# Patient Record
Sex: Male | Born: 1960 | Race: White | Hispanic: No | Marital: Single | Smoking: Former smoker
Health system: Southern US, Community
[De-identification: ages and names within clinical notes are randomized; demographics above are authoritative.]

## PROBLEM LIST (undated history)

## (undated) DIAGNOSIS — K5790 Diverticulosis of intestine, part unspecified, without perforation or abscess without bleeding: Secondary | ICD-10-CM

## (undated) DIAGNOSIS — F1011 Alcohol abuse, in remission: Secondary | ICD-10-CM

## (undated) DIAGNOSIS — N201 Calculus of ureter: Secondary | ICD-10-CM

## (undated) DIAGNOSIS — N400 Enlarged prostate without lower urinary tract symptoms: Secondary | ICD-10-CM

## (undated) DIAGNOSIS — I2510 Atherosclerotic heart disease of native coronary artery without angina pectoris: Secondary | ICD-10-CM

## (undated) DIAGNOSIS — I255 Ischemic cardiomyopathy: Secondary | ICD-10-CM

## (undated) DIAGNOSIS — I700 Atherosclerosis of aorta: Secondary | ICD-10-CM

## (undated) DIAGNOSIS — Z87891 Personal history of nicotine dependence: Secondary | ICD-10-CM

## (undated) DIAGNOSIS — Z87442 Personal history of urinary calculi: Secondary | ICD-10-CM

## (undated) DIAGNOSIS — E785 Hyperlipidemia, unspecified: Secondary | ICD-10-CM

## (undated) DIAGNOSIS — I10 Essential (primary) hypertension: Secondary | ICD-10-CM

## (undated) DIAGNOSIS — Z7902 Long term (current) use of antithrombotics/antiplatelets: Secondary | ICD-10-CM

## (undated) DIAGNOSIS — F1411 Cocaine abuse, in remission: Secondary | ICD-10-CM

## (undated) DIAGNOSIS — I252 Old myocardial infarction: Secondary | ICD-10-CM

## (undated) HISTORY — PX: INGUINAL HERNIA REPAIR: SUR1180

## (undated) HISTORY — PX: CARDIOVASCULAR STRESS TEST: SHX262

## (undated) HISTORY — DX: Ischemic cardiomyopathy: I25.5

## (undated) HISTORY — PX: CORONARY ANGIOPLASTY WITH STENT PLACEMENT: SHX49

## (undated) HISTORY — DX: Personal history of nicotine dependence: Z87.891

---

## 2006-12-18 ENCOUNTER — Inpatient Hospital Stay (HOSPITAL_COMMUNITY): Admission: EM | Admit: 2006-12-18 | Discharge: 2006-12-21 | Payer: Self-pay | Admitting: Emergency Medicine

## 2006-12-18 DIAGNOSIS — I2510 Atherosclerotic heart disease of native coronary artery without angina pectoris: Secondary | ICD-10-CM

## 2006-12-18 DIAGNOSIS — I4901 Ventricular fibrillation: Secondary | ICD-10-CM

## 2006-12-18 DIAGNOSIS — I2119 ST elevation (STEMI) myocardial infarction involving other coronary artery of inferior wall: Secondary | ICD-10-CM

## 2006-12-18 HISTORY — PX: CORONARY ANGIOPLASTY WITH STENT PLACEMENT: SHX49

## 2006-12-18 HISTORY — DX: Ventricular fibrillation: I49.01

## 2006-12-18 HISTORY — DX: ST elevation (STEMI) myocardial infarction involving other coronary artery of inferior wall: I21.19

## 2006-12-18 HISTORY — DX: Atherosclerotic heart disease of native coronary artery without angina pectoris: I25.10

## 2007-01-10 ENCOUNTER — Encounter (HOSPITAL_COMMUNITY): Admission: RE | Admit: 2007-01-10 | Discharge: 2007-01-30 | Payer: Self-pay | Admitting: Cardiology

## 2007-01-31 ENCOUNTER — Encounter (HOSPITAL_COMMUNITY): Admission: RE | Admit: 2007-01-31 | Discharge: 2007-05-01 | Payer: Self-pay | Admitting: Cardiology

## 2007-04-26 ENCOUNTER — Ambulatory Visit (HOSPITAL_COMMUNITY): Admission: RE | Admit: 2007-04-26 | Discharge: 2007-04-26 | Payer: Self-pay | Admitting: Cardiology

## 2008-02-11 ENCOUNTER — Emergency Department (HOSPITAL_COMMUNITY): Admission: EM | Admit: 2008-02-11 | Discharge: 2008-02-11 | Payer: Self-pay | Admitting: Emergency Medicine

## 2010-02-20 ENCOUNTER — Encounter: Payer: Self-pay | Admitting: Cardiology

## 2010-05-16 LAB — POCT CARDIAC MARKERS
CKMB, poc: <1 ng/mL — ABNORMAL LOW (ref 1.0–8.0)
Troponin i, poc: <0.05 ng/mL (ref 0.00–0.09)

## 2010-05-16 LAB — CBC
HCT: 49.5 % (ref 39.0–52.0)
Platelets: 291 10*3/uL (ref 150–400)
RDW: 12.9 % (ref 11.5–15.5)

## 2010-05-16 LAB — POCT I-STAT, CHEM 8
BUN: 16 mg/dL (ref 6–23)
Calcium, Ion: 1.14 mmol/L (ref 1.12–1.32)
Chloride: 106 mEq/L (ref 96–112)
Glucose, Bld: 84 mg/dL (ref 70–99)
Hemoglobin: 17.3 g/dL — ABNORMAL HIGH (ref 13.0–17.0)
Potassium: 3.7 mEq/L (ref 3.5–5.1)
TCO2: 21 mmol/L (ref 0–100)

## 2010-05-16 LAB — DIFFERENTIAL
Basophils Relative: 0 % (ref 0–1)
Eosinophils Relative: 1 % (ref 0–5)
Monocytes Relative: 7 % (ref 3–12)
Neutrophils Relative %: 77 % (ref 43–77)

## 2010-06-14 NOTE — Discharge Summary (Signed)
[name], [name]              ACCOUNT NO.:  [id number]   MEDICAL RECORD NO.:  [id number]          PATIENT TYPE:  INP   LOCATION:  2020                         FACILITY:  [facility]   PHYSICIAN:  Eduardo Osier. Sharyn Lull, M.D. DATE OF BIRTH:  1960/02/10   DATE OF ADMISSION:  12/18/2006  DATE OF DISCHARGE:  12/21/2006                               DISCHARGE SUMMARY   ADMISSION DIAGNOSES:  1. Acute inferoposterior wall myocardial infarction.  2. Tobacco abuse.  3. Positive family history of coronary artery disease.  4. History of cocaine abuse in the remote past.   DISCHARGE DIAGNOSES:  1. Acute inferoposterior wall myocardial infarction, status post      emergency percutaneous transluminal coronary angioplasty and      stenting to proximal and mid right coronary artery.  2. Tobacco abuse.  3. Family history of coronary artery disease.  4. Hypercholesteremia, status post nonsustained ventricular      tachycardia and paroxysmal atrial fibrillation.  5. Remote history of cocaine abuse in the past.   DISCHARGE MEDICATIONS:  Enteric-coated aspirin 325 mg 1 tablet daily,  Plavix 75 mg 1 tablet daily with food, Toprol XL 25 mg 1 tablet daily,  Altace 2.5 mg 1 capsule daily, Lipitor 80 mg 1 tablet daily, Nitrostat  0.4 mg sublingual use as directed.   DIET:  Low salt, low cholesterol.   ACTIVITY:  Increase activity slowly as tolerated.  No lifting, driving  or sexual activity for 1 week.   Post PTCA and stent instructions have been given.  Follow up with me in  1 week.   CONDITION ON DISCHARGE:  Stable.  The patient will be scheduled for  phase II cardiac rehab as outpatient.   BRIEF HISTORY AND HOSPITAL COURSE:  Mr. Jeremy [name] is a 50 year old white  male with no significant past medical history except for tobacco abuse  and remote cocaine abuse, came to the ER via EMS complaining of  retrosternal chest pressure, grade 9/10 associated with diaphoresis  which woke him up around 1:30 a.m.  He   went to bed again, woke up  around 2:30 a.m. with similar chest pressures so called EMS.  EKG done  on the field showed normal sinus rhythm with ST elevation in inferior  leads and ST depression in V1 and V2 and reciprocal changes in I and aVL  suggestive of inferoposterior wall injury pattern.  The patient states  he had similar chest pain but less severe approximately 1 week ago but  did not seek medical attention.  Denies any recent cocaine abuse.   PAST MEDICAL HISTORY:  As above.   PAST SURGICAL HISTORY:  Had inguinal hernia repair.   SOCIAL HISTORY:  He is single.  Smoked 1 pack per day for 3 to 4 years,  quit 1 week ago.  No history of alcohol abuse.  Smoked cocaine a few  years ago.   FAMILY HISTORY:  Father died of MI at age of 51.  Mother is alive in  good health.  One brother and one sister in good health.   ALLERGIES:  NO KNOWN DRUG ALLERGIES.  MEDICATIONS:  None.   PHYSICAL EXAMINATION:  GENERAL:  On examination he is alert and oriented  x 3, complaining of severe chest pain.  VITAL SIGNS:  Blood pressure was 114/70, pulse was 66 regular.  HEENT:  Conjunctivae were pink.  NECK:  Supple, no JVD.  LUNGS:  Clear to auscultation without rhonchi or rales.  CARDIOVASCULAR:  S1 and S2 were normal.  There was soft systolic murmur and S4 gallop.  ABDOMEN:  Soft.  Bowel sounds were present, nontender.  EXTREMITIES:  There is no clubbing, cyanosis or edema.   LABORATORY DATA:  His labs are not available in the chart.  His  admission EKG showed normal sinus rhythm with ST elevation in II, III,  aVF and ST depression in V1 and V2 with reciprocal changes in lead I and  aVL suggestive of inferoposterior wall injury pattern.  Repeat EKG done  on November 18 showed atrial fibrillation with moderate ventricular  response, marked improvement in ST elevation and resolution of ST  depression in V1 and V2.  Repeat EKG on November 19 showed normal sinus  rhythm with minimal ST  elevation and very small Q-waves in the inferior  leads, resolution of ST depression in V1 and V2.  There were nonspecific  T-wave changes.   BRIEF HOSPITAL COURSE:  The patient was directly taken to the cath lab  and underwent emergency PTCA and stenting to RCA as per procedure report  with excellent results with resolution of his ST depression during the  procedure.  The patient tolerated the procedure well.  The patient did  not have any episodes of anginal chest pain during the hospital stay but  had a brief episode of nonsustained VT and paroxysmal A Fib,  which  resolved by IV amiodarone for 24 hours.  The patient is off amiodarone  for the last 2 days.  There is no evidence of cardiac arrhythmias.  Phase 1 cardiac rehab was called.  The patient has been ambulating in  the hallway without any problems.  His groin is stable with no evidence  of hematoma or bruit.  There is some mild ecchymosis in the right groin.  The patient is interested in phase 2  cardiac rehab and will be  scheduled as outpatient.  The patient will be discharged home on above  medications and will be followed up in my office in 1 week      Jeremy N. Sharyn Lull, M.D.  Electronically Signed     M[facility]/MEDQ  D:  12/21/2006  T:  12/21/2006  Job:  [id number]   cc:   Cath Lab

## 2010-06-14 NOTE — Cardiovascular Report (Signed)
Jeremy [name], Jeremy [name]              ACCOUNT NO.:  [id number]   MEDICAL RECORD NO.:  [id number]          PATIENT TYPE:  INP   LOCATION:  2901                         FACILITY:  [facility]   PHYSICIAN:  Jeremy [name]. Jeremy [name], M.D. DATE OF BIRTH:  04-Jun-1960   DATE OF PROCEDURE:  12/18/2006  DATE OF DISCHARGE:                            CARDIAC CATHETERIZATION   PROCEDURE:  1. Left cardiac cath with selective left and right coronary      angiography, LV graft via right groin using Judkins technique.  2. Insertion of temporary transvenous pacer via right femoral venous      approach.  3. Successful PTCA to proximal RCA using 2.5 x 13-mm long Voyager      balloon.  4. Successful deployment of 3.5 x 23-mm long Cypher drug-eluting stent      in proximal and mid RCA.  5. Successful aspiration of thrombus using French aspiration catheter.   INDICATIONS FOR PROCEDURE:  Jeremy [name] is 50 year old white male with no  significant past medical history except for tobacco abuse, remote  history of cocaine abuse and positive family history of coronary artery  disease. He came to the ER via EMS complaining of retrosternal chest  pressure grade 9 or 10 associated with diaphoresis which woke him up  around 1:30 a.m. He went to the bed again and woke up around 2:30 a.m.  with similar chest pressure so called EMS.  EKG done on the field showed  normal sinus rhythm with ST elevation in inferior leads and ST  depression in V1 and V2 and __________ depression in leads I and aVL  suggestive of inferoposterior wall injury pattern.  The patient states  he had similar chest pain but less severe approximately 1 week ago but  did not seek any medical attention.  Denies any recent cocaine abuse.   PAST MEDICAL HISTORY:  As above.   PAST SURGICAL HISTORY:  He had an inguinal hernia repair in the past.   SOCIAL HISTORY:  He is single and smokes one pack per day for 3-4 years,  quit 1 week ago.  No history of alcohol  abuse.  Smoked cocaine a few  years ago.   FAMILY HISTORY:  Father died of MI at the age of 46.  Mother is alive in  good health.  One brother and one sister in good health.   ALLERGIES:  No known drug allergies.   MEDICATIONS:  None.   PHYSICAL EXAMINATION:  He is alert and oriented x3 in no acute distress  complaining of severe chest pain.  VITAL SIGNS:  Blood pressure was 114/70, pulse was 66 and regular.  Conjunctivae was pink.  NECK:  Supple.  No JVD.  LUNGS:  Clear to auscultation without rhonchi or rales.  CARDIOVASCULAR:  S1, S2 was normal.  There was a soft systolic murmur  and S4 gallop.  ABDOMEN:  Soft.  Bowel sounds are present, nontender.  EXTREMITIES:  There is no clubbing, cyanosis or edema.   IMPRESSION:  Acute inferoposterior wall MI, tobacco abuse, positive  family history of coronary artery disease, history of cocaine abuse.  Discussed  with the patient regarding emergency left cath, possible PTCA  stenting, its risks and benefits i.e. death and mild stroke, need for  emergency CABG, risk of restenosis, risk of cardiac arrhythmias and  insertion of temporary pacer, etc. and consented for PCI.   PROCEDURE:  After obtaining informed consent, the patient was brought to  the cath lab and was placed on the fluoroscopy table.  The right groin  was prepped and draped in the usual fashion.  1% Xylocaine was used for  local anesthesia in the right groin. With the help of a thin-wall  needle,  a 6-French arterial and 6-French arterial and 6-French venous  sheaths were placed. Both these sheaths were aspirated and flushed.  Next a 6-French left Judkins catheter was advanced over the wire under  fluoroscopic guidance up to the ascending aorta.  The wire was pulled  out, the catheter was aspirated and connected to the manifold.  The  catheter was further advanced and engaged into the left coronary ostium.  Multiple views of the left system were taken.  Next the catheter  was  disengaged and was pulled out over the wire and was replaced with a 6-  [place] right Judkins catheter which was advanced over the wire under  fluoroscopic guidance up to the ascending aorta.  The wire was pulled  out, the catheter was aspirated and connected to the manifold.  The  catheter was further advanced and engaged into right coronary ostium.  The single view of right coronary artery was obtained.  Next the right  Judkins catheter was pulled out over the wire and was replaced with a 6-  French pigtail catheter at the end of the procedure which was advanced  over the wire under fluoroscopic guidance up to the ascending aorta.  The catheter was further advanced across aortic valve into the LV and LV  pressures were recorded.  Next LV graft was done in 30 degree RAO  position.  Post angiographic pressures were recorded from LV and then  pullback pressures were recorded from the aorta.  There was no gradient  across the aortic valve.  Next, a pigtail catheter was pulled out over  the wire, sheaths were aspirated and flushed.   FINDINGS:  LV showed good LV systolic function, EF of 50-55%, the left  main was patent.  The LAD has 30-35% mid stenosis.  Diagonal 1 and  diagonal 2 were small which were patent.  Diagonal 3 was very small. The  left circumflex has 60-70% proximal stenosis and 40-50% mid sequential  stenosis.  OM1 is large which has 20-25% proximal stenosis.  OM-2 is  small but is patent.  RCA was 100% occluded proximally with TIMI 0 flow.  A temporary transvenous pacer was inserted via the right femoral venous  approach up to RV apex without difficulty prior to PCI.   INTERVENTIONAL PROCEDURE:  Successful PTCA to proximal RCA was done  using 2.5 x 13-mm long Voyager balloon for predilatation and then 3.5 x  23-mm long Cypher drug-eluting stent was deployed in proximal RCA at 15  atmospheric pressure.  The stent was postdilated using 4.0 x 12-mm long  PowerSail balloon  going up to 21 atmospheres of pressure.  Angiogram  showed filling defect in the mid RCA suggestive of thrombus.  Multiple  passes of [place] catheter were done for thrombus aspiration with minimal  improvement in angiographic appearance and then 3.5 x 23-mm long Cypher  drug-eluting stent was deployed in mid RCA overlapping  the proximal  stent at 16 atmospheric pressure.  Stent was postdilated using 4.0 x 18-  mm long PowerSail balloon going up to 23 atmospheres pressure. The  lesion was dilated from 100% to 0% as well as excellent TIMI grade 3  distal flow without evidence of dissection or distal embolization.  The  patient had one episode of V-fib prior to PCI requiring defibrillation  x1 with conversion to sinus rhythm.  The patient received weight based  heparin and __________ 600 mg of Plavix, intracoronary nitrates and  verapamil during the procedure.  The temporary pacemaker was  discontinued at the end of the procedure.  The patient tolerated the  procedure well.  There were no complications.  The patient also received a bolus of amiodarone and was started on the  amiodarone drip at the end of the procedure.  The patient tolerated the  procedure well.  There were no complications.  The patient was  transferred to recovery room in stable condition.      Jeremy [name]. Jeremy [name], M.D.  Electronically Signed     MNH/MEDQ  D:  12/18/2006  T:  12/18/2006  Job:  [id number]

## 2010-11-08 LAB — POCT CARDIAC MARKERS: CKMB, poc: <1 — ABNORMAL LOW

## 2010-11-08 LAB — BASIC METABOLIC PANEL
Calcium: 8.8
Chloride: 106
Creatinine, Ser: 0.87
Creatinine, Ser: 0.97
GFR calc Af Amer: >60
GFR calc non Af Amer: >60

## 2010-11-08 LAB — COMPREHENSIVE METABOLIC PANEL
AST: 117 — ABNORMAL HIGH
Alkaline Phosphatase: 45
Calcium: 7.4 — ABNORMAL LOW
Creatinine, Ser: 0.87
GFR calc Af Amer: >60
Potassium: 4

## 2010-11-08 LAB — PROTIME-INR: Prothrombin Time: 13.3

## 2010-11-08 LAB — CARDIAC PANEL(CRET KIN+CKTOT+MB+TROPI)
Relative Index: 7.1 — ABNORMAL HIGH
Relative Index: 7.9 — ABNORMAL HIGH
Total CK: 310 — ABNORMAL HIGH
Total CK: 779 — ABNORMAL HIGH
Troponin I: 10.44
Troponin I: 16.57

## 2010-11-08 LAB — I-STAT 8, (EC8 V) (CONVERTED LAB)
Acid-Base Excess: 1
BUN: 13
Chloride: 104
Glucose, Bld: 131 — ABNORMAL HIGH
Sodium: 140
TCO2: 26
pCO2, Ven: 36.8 — ABNORMAL LOW

## 2010-11-08 LAB — URINE DRUGS OF ABUSE SCREEN W ALC, ROUTINE (REF LAB)
Amphetamine Screen, Ur: NEGATIVE
Barbiturate Quant, Ur: NEGATIVE
Benzodiazepines.: POSITIVE — AB
Cocaine Metabolites: NEGATIVE
Ethyl Alcohol: <5
Marijuana Metabolite: NEGATIVE
Methadone: NEGATIVE
Opiate Screen, Urine: POSITIVE — AB
Phencyclidine (PCP): NEGATIVE
Propoxyphene: NEGATIVE

## 2010-11-08 LAB — LIPID PANEL
Cholesterol: 167
HDL: 29 — ABNORMAL LOW
Total CHOL/HDL Ratio: 5.8
Triglycerides: 94

## 2010-11-08 LAB — CBC
HCT: 35.8 — ABNORMAL LOW
Hemoglobin: 12.2 — ABNORMAL LOW
Hemoglobin: 14.3
MCHC: 34.1
MCHC: 34.9
MCV: 88.3
MCV: 89.2
Platelets: 251
Platelets: 257
RBC: 4.02 — ABNORMAL LOW
RDW: 12.1
RDW: 12.2
WBC: 11.6 — ABNORMAL HIGH

## 2010-11-08 LAB — OPIATE, QUANTITATIVE, URINE
Codeine Urine: NEGATIVE ng/mL
Hydrocodone: NEGATIVE ng/mL
Hydromorphone GC/MS Conf: NEGATIVE ng/mL
Morphine, Confirm: 2560 ng/mL

## 2010-11-08 LAB — BENZODIAZEPINE, QUANTITATIVE, URINE
Alprazolam (GC/LC/MS), ur confirm: NEGATIVE
Nordiazepam GC/MS Conf: NEGATIVE

## 2010-11-08 LAB — PLATELET COUNT: Platelets: 308

## 2011-02-01 ENCOUNTER — Other Ambulatory Visit: Payer: Self-pay | Admitting: Cardiology

## 2011-08-18 ENCOUNTER — Other Ambulatory Visit: Payer: Self-pay | Admitting: Cardiology

## 2011-09-24 ENCOUNTER — Emergency Department: Payer: Self-pay | Admitting: Emergency Medicine

## 2011-09-24 LAB — URINALYSIS, COMPLETE
Bacteria: NONE SEEN
Glucose,UR: NEGATIVE mg/dL (ref 0–75)
Leukocyte Esterase: NEGATIVE
Nitrite: NEGATIVE
Protein: NEGATIVE
Specific Gravity: 1.024 (ref 1.003–1.030)
WBC UR: 1 /HPF (ref 0–5)

## 2011-09-24 LAB — COMPREHENSIVE METABOLIC PANEL
Anion Gap: 7 (ref 7–16)
Calcium, Total: 9 mg/dL (ref 8.5–10.1)
Chloride: 108 mmol/L — ABNORMAL HIGH (ref 98–107)
Co2: 26 mmol/L (ref 21–32)
EGFR (African American): >60
Osmolality: 283 (ref 275–301)
Potassium: 4.3 mmol/L (ref 3.5–5.1)
SGOT(AST): 25 U/L (ref 15–37)

## 2011-09-24 LAB — CBC
MCH: 29.2 pg (ref 26.0–34.0)
MCHC: 33.8 g/dL (ref 32.0–36.0)
RDW: 12.5 % (ref 11.5–14.5)

## 2011-09-24 LAB — LIPASE, BLOOD: Lipase: 142 U/L (ref 73–393)

## 2011-09-27 ENCOUNTER — Other Ambulatory Visit: Payer: Self-pay | Admitting: Urology

## 2011-11-08 ENCOUNTER — Encounter (HOSPITAL_BASED_OUTPATIENT_CLINIC_OR_DEPARTMENT_OTHER): Payer: Self-pay | Admitting: *Deleted

## 2011-11-08 NOTE — Progress Notes (Signed)
NPO AFTER MN. ARRIVES AT NEED ISTAT AND KUB. LAST OFFICE NOTE, EKG AND ANY CARDIAC TEST TO BE FAXED FROM DR Sharyn Lull [phone]) . WILL TAKE METOPROLOL AND LIPITOR AM OF SURG W/ SIP OF WATER.

## 2011-11-10 NOTE — H&P (Signed)
istory of Present Illness      Notes from [facility] he was seen on 09/24/11 revealed he was expressing frequency and hesitancy as well as a two-week history of right flank pain. His creatinine was noted to be normal 1.0 and a CT scan done revealed an 8 mm stone located either in the bladder or at the ureterovesical junction with minimal hydronephrosis noted on the right. No mention of renal calculi was made in the report.     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   Interval history: He reports he continues to have intermittent pain. He also is having frequency and urgency as well as nocturia. This is associated with back and flank pain. It is not modified by positional change. It is moderate in severity.   Past Medical History Problems  1. History of  Acute Myocardial Infarction V12.59 2. 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 History of  Hypercholesterolemia 272.0  Surgical History Problems  1. History of  Heart Surgery 2. History of  Hernia Repair 3. History of  Tonsillectomy  Current Meds 1. Lipitor TABS; Therapy: (Recorded:05May2010) to 2. MetFORMIN HCl TABS; Therapy: (Recorded:05May2010) to 3. Plavix TABS; Therapy: (Recorded:05May2010) to  Allergies Medication  1. No Known Drug Allergies  Family History Problems  1. Paternal history of  Death In The Family Father age 26; heart attack 2. Family history of  Family Health Status Number Of Children 1 son  Social History Problems    Caffeine Use 1 a day   Marital History - Single   Occupation: industrial   Tobacco Use V15.82 1 pk a day for 5 yrs; quit 3 yrs ago Denied    Alcohol Use  Review of Systems Genitourinary, constitutional, skin, eye, otolaryngeal, hematologic/lymphatic, cardiovascular, pulmonary, endocrine, musculoskeletal, gastrointestinal, neurological and psychiatric system(s) were reviewed and pertinent findings if  present are noted.  Genitourinary: urinary frequency, nocturia, weak urinary stream, hematuria, erectile dysfunction and penile pain.  Gastrointestinal: constipation.  Musculoskeletal: back pain.    Vitals Vital Signs [  BMI Calculated: 28.79 BSA Calculated: 2 Height: 5 ft 8 in Weight: 190 lb  Temperature: 97 F  Physical Exam Constitutional: Well nourished and well developed . No acute distress.  ENT:. The ears and nose are normal in appearance.  Neck: The appearance of the neck is normal and no neck mass is present.  Pulmonary: No respiratory distress and normal respiratory rhythm and effort.  Cardiovascular: Heart rate and rhythm are normal . No peripheral edema.  Abdomen: The abdomen is soft and nontender. No masses are palpated. No CVA tenderness. No hernias are palpable. No hepatosplenomegaly noted.  Lymphatics: The femoral and inguinal nodes are not enlarged or tender.  Skin: Normal skin turgor, no visible rash and no visible skin lesions.  Neuro/Psych:. Mood and affect are appropriate.    Results/Data Urine [ COLOR YELLOW   APPEARANCE CLEAR   SPECIFIC GRAVITY 1.020   pH 6.5   GLUCOSE NEG mg/dL  BILIRUBIN NEG   KETONE NEG mg/dL  BLOOD TRACE   PROTEIN NEG mg/dL  UROBILINOGEN 2 mg/dL  NITRITE NEG   LEUKOCYTE ESTERASE NEG   SQUAMOUS EPITHELIAL/HPF NONE SEEN   WBC 0-2 WBC/hpf  RBC 3-6 RBC/hpf  BACTERIA RARE   CRYSTALS NONE SEEN   CASTS NONE SEEN    Old records or history reviewed: Notes from the ER as above.  The following images/tracing/specimen were independently visualized:  KUB:.  The following clinical lab  reports were reviewed:  His urinalysis had microscopic hematuria but was otherwise negative in the ER.  The following radiology reports were reviewed: CT scan as above.    Assessment Assessed  1. Distal Ureteral Stone On The Right 592.1   We discussed the fact that he is a stone located in his intramural ureter. That is what is causing his irritative  symptoms I therefore recommended VESIcare and will give him samples of that to help with those symptoms. Meantime he is already on medical expulsive therapy. We did discuss the treatment options including lithotripsy and ureteroscopy. The location of the stone would lend itself best to ureteroscopy I therefore went over the procedure with him in detail including its risks and complications. He is going to proceed with ureteroscopy and laser lithotripsy of his distal right ureteral stone   Plan Distal Ureteral Stone On The Right (592.1)     1. VESIcare samples 5 mg given today. 2. I gave him a prescription for oxycodone. 3. Be scheduled for right ureteroscopy and laser lithotripsy of his right distal ureteral stone.

## 2011-11-13 ENCOUNTER — Encounter (HOSPITAL_BASED_OUTPATIENT_CLINIC_OR_DEPARTMENT_OTHER)
Admission: RE | Disposition: A | Discharge status: Home / Self Care | Payer: Self-pay | Source: Ambulatory Visit | Attending: Urology

## 2011-11-13 ENCOUNTER — Ambulatory Visit (HOSPITAL_COMMUNITY): Payer: 59

## 2011-11-13 ENCOUNTER — Ambulatory Visit (HOSPITAL_BASED_OUTPATIENT_CLINIC_OR_DEPARTMENT_OTHER)
Admission: RE | Admit: 2011-11-13 | Discharge: 2011-11-13 | Disposition: A | Discharge status: Home / Self Care | Payer: 59 | Source: Ambulatory Visit | Attending: Urology | Admitting: Urology

## 2011-11-13 ENCOUNTER — Ambulatory Visit (HOSPITAL_BASED_OUTPATIENT_CLINIC_OR_DEPARTMENT_OTHER): Payer: 59 | Admitting: Anesthesiology

## 2011-11-13 ENCOUNTER — Encounter (HOSPITAL_BASED_OUTPATIENT_CLINIC_OR_DEPARTMENT_OTHER): Payer: Self-pay | Admitting: Anesthesiology

## 2011-11-13 ENCOUNTER — Encounter (HOSPITAL_BASED_OUTPATIENT_CLINIC_OR_DEPARTMENT_OTHER): Payer: Self-pay | Admitting: *Deleted

## 2011-11-13 DIAGNOSIS — Z79899 Other long term (current) drug therapy: Secondary | ICD-10-CM | POA: Insufficient documentation

## 2011-11-13 DIAGNOSIS — E7800 Pure hypercholesterolemia, unspecified: Secondary | ICD-10-CM | POA: Insufficient documentation

## 2011-11-13 DIAGNOSIS — N201 Calculus of ureter: Secondary | ICD-10-CM | POA: Insufficient documentation

## 2011-11-13 DIAGNOSIS — I252 Old myocardial infarction: Secondary | ICD-10-CM | POA: Insufficient documentation

## 2011-11-13 DIAGNOSIS — N1330 Unspecified hydronephrosis: Secondary | ICD-10-CM | POA: Insufficient documentation

## 2011-11-13 DIAGNOSIS — Z7902 Long term (current) use of antithrombotics/antiplatelets: Secondary | ICD-10-CM | POA: Insufficient documentation

## 2011-11-13 HISTORY — DX: Calculus of ureter: N20.1

## 2011-11-13 HISTORY — DX: Hyperlipidemia, unspecified: E78.5

## 2011-11-13 HISTORY — PX: URETEROSCOPY: SHX842

## 2011-11-13 HISTORY — DX: Essential (primary) hypertension: I10

## 2011-11-13 HISTORY — DX: Old myocardial infarction: I25.2

## 2011-11-13 HISTORY — DX: Cocaine abuse, in remission: F14.11

## 2011-11-13 HISTORY — DX: Atherosclerotic heart disease of native coronary artery without angina pectoris: I25.10

## 2011-11-13 LAB — POCT I-STAT 4, (NA,K, GLUC, HGB,HCT)
Glucose, Bld: 104 mg/dL — ABNORMAL HIGH (ref 70–99)
HCT: 43 % (ref 39.0–52.0)
Hemoglobin: 14.6 g/dL (ref 13.0–17.0)
Potassium: 4 mEq/L (ref 3.5–5.1)
Sodium: 142 mEq/L (ref 135–145)

## 2011-11-13 SURGERY — URETEROSCOPY
Anesthesia: General | Laterality: Right

## 2011-11-13 MED ORDER — SODIUM CHLORIDE 0.9 % IR SOLN
Status: DC | PRN
  Administered 2011-11-13: 6000 mL

## 2011-11-13 MED ORDER — CIPROFLOXACIN IN D5W 200 MG/100ML IV SOLN
200.0000 mg | INTRAVENOUS | Status: AC
  Administered 2011-11-13: 200 mg via INTRAVENOUS

## 2011-11-13 MED ORDER — HYDROCODONE-ACETAMINOPHEN 10-325 MG PO TABS: 1.0000 | ORAL_TABLET | Freq: Four times a day (QID) | ORAL | Status: DC | PRN

## 2011-11-13 MED ORDER — KETOROLAC TROMETHAMINE 30 MG/ML IJ SOLN
INTRAMUSCULAR | Status: DC | PRN
  Administered 2011-11-13: 30 mg via INTRAVENOUS

## 2011-11-13 MED ORDER — PROMETHAZINE HCL 25 MG/ML IJ SOLN: 6.2500 mg | INTRAMUSCULAR | Status: DC | PRN

## 2011-11-13 MED ORDER — BELLADONNA ALKALOIDS-OPIUM 16.2-60 MG RE SUPP
RECTAL | Status: DC | PRN
  Administered 2011-11-13: 1 via RECTAL

## 2011-11-13 MED ORDER — LIDOCAINE HCL (CARDIAC) 20 MG/ML IV SOLN
INTRAVENOUS | Status: DC | PRN
  Administered 2011-11-13: 100 mg via INTRAVENOUS

## 2011-11-13 MED ORDER — HYDROMORPHONE HCL PF 1 MG/ML IJ SOLN: 0.2500 mg | INTRAMUSCULAR | Status: DC | PRN

## 2011-11-13 MED ORDER — PROPOFOL 10 MG/ML IV BOLUS
INTRAVENOUS | Status: DC | PRN
  Administered 2011-11-13: 250 mg via INTRAVENOUS

## 2011-11-13 MED ORDER — OXYCODONE HCL 5 MG/5ML PO SOLN: 5.0000 mg | Freq: Once | ORAL | Status: AC | PRN

## 2011-11-13 MED ORDER — ACETAMINOPHEN 10 MG/ML IV SOLN: 1000.0000 mg | Freq: Once | INTRAVENOUS | Status: DC | PRN

## 2011-11-13 MED ORDER — MIDAZOLAM HCL 5 MG/5ML IJ SOLN
INTRAMUSCULAR | Status: DC | PRN
  Administered 2011-11-13: 2 mg via INTRAVENOUS

## 2011-11-13 MED ORDER — MEPERIDINE HCL 25 MG/ML IJ SOLN: 6.2500 mg | INTRAMUSCULAR | Status: DC | PRN

## 2011-11-13 MED ORDER — ONDANSETRON HCL 4 MG/2ML IJ SOLN
INTRAMUSCULAR | Status: DC | PRN
  Administered 2011-11-13: 4 mg via INTRAVENOUS

## 2011-11-13 MED ORDER — IOHEXOL 350 MG/ML SOLN
INTRAVENOUS | Status: DC | PRN
  Administered 2011-11-13: 3 mL

## 2011-11-13 MED ORDER — LIDOCAINE HCL 2 % EX GEL
CUTANEOUS | Status: DC | PRN
  Administered 2011-11-13: 1 via URETHRAL

## 2011-11-13 MED ORDER — LACTATED RINGERS IV SOLN
INTRAVENOUS | Status: DC
  Administered 2011-11-13: 100 mL/h via INTRAVENOUS
  Administered 2011-11-13: 08:00:00 via INTRAVENOUS

## 2011-11-13 MED ORDER — PHENAZOPYRIDINE HCL 200 MG PO TABS
200.0000 mg | ORAL_TABLET | Freq: Three times a day (TID) | ORAL | Status: AC
  Administered 2011-11-13: 200 mg via ORAL

## 2011-11-13 MED ORDER — DEXAMETHASONE SODIUM PHOSPHATE 4 MG/ML IJ SOLN
INTRAMUSCULAR | Status: DC | PRN
  Administered 2011-11-13: 8 mg via INTRAVENOUS

## 2011-11-13 MED ORDER — OXYCODONE HCL 5 MG PO TABS
5.0000 mg | ORAL_TABLET | Freq: Once | ORAL | Status: AC | PRN
  Administered 2011-11-13: 5 mg via ORAL

## 2011-11-13 MED ORDER — FENTANYL CITRATE 0.05 MG/ML IJ SOLN
INTRAMUSCULAR | Status: DC | PRN
  Administered 2011-11-13: 100 ug via INTRAVENOUS
  Administered 2011-11-13: 25 ug via INTRAVENOUS

## 2011-11-13 MED ORDER — PHENAZOPYRIDINE HCL 200 MG PO TABS: 200.0000 mg | ORAL_TABLET | Freq: Three times a day (TID) | ORAL | Status: DC | PRN

## 2011-11-13 SURGICAL SUPPLY — 38 items
ADAPTER CATH URET PLST 4-6FR (CATHETERS) IMPLANT
ADPR CATH URET STRL DISP 4-6FR (CATHETERS)
BAG DRAIN URO-CYSTO SKYTR STRL (DRAIN) ×2 IMPLANT
BAG DRN UROCATH (DRAIN) ×1
BASKET LASER NITINOL 1.9FR (BASKET) ×2 IMPLANT
BASKET STNLS GEMINI 4WIRE 3FR (BASKET) IMPLANT
BASKET ZERO TIP NITINOL 2.4FR (BASKET) ×2 IMPLANT
BRUSH URET BIOPSY 3F (UROLOGICAL SUPPLIES) IMPLANT
BSKT STON RTRVL 120 1.9FR (BASKET) ×1
BSKT STON RTRVL GEM 120X11 3FR (BASKET)
BSKT STON RTRVL ZERO TP 2.4FR (BASKET) ×1
CANISTER SUCT LVC 12 LTR MEDI- (MISCELLANEOUS) ×2 IMPLANT
CATH INTERMIT  6FR 70CM (CATHETERS) IMPLANT
CATH URET 5FR 28IN CONE TIP (BALLOONS)
CATH URET 5FR 70CM CONE TIP (BALLOONS) IMPLANT
CLOTH BEACON ORANGE TIMEOUT ST (SAFETY) ×2 IMPLANT
DRAPE CAMERA CLOSED 9X96 (DRAPES) ×2 IMPLANT
ELECT REM PT RETURN 9FT ADLT (ELECTROSURGICAL)
ELECTRODE REM PT RTRN 9FT ADLT (ELECTROSURGICAL) IMPLANT
GLOVE BIO SURGEON STRL SZ8 (GLOVE) ×2 IMPLANT
GOWN PREVENTION PLUS LG XLONG (DISPOSABLE) ×2 IMPLANT
GOWN STRL REIN XL XLG (GOWN DISPOSABLE) ×2 IMPLANT
GUIDEWIRE 0.038 PTFE COATED (WIRE) IMPLANT
GUIDEWIRE ANG ZIPWIRE 038X150 (WIRE) IMPLANT
GUIDEWIRE STR DUAL SENSOR (WIRE) ×2 IMPLANT
IV NS IRRIG 3000ML ARTHROMATIC (IV SOLUTION) ×8 IMPLANT
KIT BALLIN UROMAX 15FX10 (LABEL) IMPLANT
KIT BALLN UROMAX 15FX4 (MISCELLANEOUS) IMPLANT
KIT BALLN UROMAX 26 75X4 (MISCELLANEOUS)
LASER FIBER DISP (UROLOGICAL SUPPLIES) ×2 IMPLANT
PACK CYSTOSCOPY (CUSTOM PROCEDURE TRAY) ×2 IMPLANT
SET HIGH PRES BAL DIL (LABEL)
SHEATH ACCESS URETERAL 38CM (SHEATH) IMPLANT
SHEATH ACCESS URETERAL 54CM (SHEATH) IMPLANT
SHEATH URET ACCESS 12FR/35CM (UROLOGICAL SUPPLIES) ×2 IMPLANT
SHEATH URET ACCESS 12FR/55CM (UROLOGICAL SUPPLIES) IMPLANT
STENT URET 6FRX24 CONTOUR (STENTS) ×2 IMPLANT
WATER STERILE IRR 3000ML UROMA (IV SOLUTION) IMPLANT

## 2011-11-13 NOTE — Anesthesia Preprocedure Evaluation (Addendum)
Anesthesia Evaluation  Patient identified by MRN, date of birth, ID band Patient awake    Reviewed: Allergy & Precautions, H&P , NPO status , Patient's Chart, lab work & pertinent test results  Airway Mallampati: II TM Distance: >3 FB Neck ROM: Full    Dental  (+) Dental Advisory Given and Teeth Intact   Pulmonary former smoker,  breath sounds clear to auscultation  Pulmonary exam normal       Cardiovascular hypertension, Pt. on medications and Pt. on home beta blockers + CAD Rhythm:Regular Rate:Normal     Neuro/Psych negative neurological ROS  negative psych ROS   GI/Hepatic negative GI ROS, (+)     substance abuse  cocaine use,   Endo/Other  negative endocrine ROS  Renal/GU negative Renal ROS     Musculoskeletal negative musculoskeletal ROS (+)   Abdominal   Peds  Hematology negative hematology ROS (+)   Anesthesia Other Findings   Reproductive/Obstetrics                         Anesthesia Physical Anesthesia Plan  ASA: II  Anesthesia Plan: General   Post-op Pain Management:    Induction: Intravenous  Airway Management Planned: LMA  Additional Equipment:   Intra-op Plan:   Post-operative Plan: Extubation in OR  Informed Consent: I have reviewed the patients History and Physical, chart, labs and discussed the procedure including the risks, benefits and alternatives for the proposed anesthesia with the patient or authorized representative who has indicated his/her understanding and acceptance.   Dental advisory given  Plan Discussed with: CRNA  Anesthesia Plan Comments:        Anesthesia Quick Evaluation

## 2011-11-13 NOTE — Op Note (Signed)
PATIENT:  Jeremy [name]  PRE-OPERATIVE DIAGNOSIS:  right Ureteral calculus  POST-OPERATIVE DIAGNOSIS: Same  PROCEDURE:  1. Cystoscopy with right retrograde pyelogram including interpretation 2. Right ureteroscopy with laser lithotripsy and stone extraction 3. Right double-J stent placement  SURGEON: Garnett Farm, MD  INDICATION: Jeremy [name] is a 52 year old male who began experiencing frequency and hesitancy with associated right flank pain. A CT scan revealed an 8 mm stone located in the bladder or ureterovesical junction according to the radiologist at [facility] however it appeared to me to be near the intramural ureter. He was placed on medical expulsive therapy without spontaneous passage of the stone and presents today for ureteroscopic management.  ANESTHESIA:  General  EBL:  Minimal  DRAINS: 6 French, 24 cm Contour stent (with string)  SPECIMEN:  Stone  DISPOSITION OF SPECIMEN:  Given to patient  DESCRIPTION OF PROCEDURE: The patient was taken to the major OR and placed on the table. General anesthesia was administered and then the patient was moved to the dorsal lithotomy position. The genitalia was sterilely prepped and draped. An official timeout was performed.  Initially the 22 French cystoscope with 12 lens was passed under direct vision down the urethra which is noted be normal. The prostatic urethra revealed no lesions or obstruction. The bladder was then entered and fully inspected. It was noted be free of any tumors stones or inflammatory lesions. Ureteral orifices were of normal configuration and position. There was some edema associated with the right ureteral orifice. A 6 French open-ended ureteral catheter was then passed through the cystoscope into the ureteral orifice in order to perform a right retrograde pyelogram.  A retrograde pyelogram was performed by injecting full-strength contrast up the right ureter under direct fluoroscopic control.  It revealed a filling defect in the distal lureter consistent with the stone seen on the preoperative KUB. The remainder of the ureter was noted to be normal as was the intrarenal collecting system. I then passed a 0.038 inch floppy-tipped guidewire through the open ended catheter and into the area of the renal pelvis and this was left in place. The inner portion of a ureteral access sheath was then passed over the guidewire to gently dilate the intramural ureter. I then proceeded with ureteroscopy.  A 6 French rigid ureteroscope was then passed under direct into the bladder and into the right orifice and up the ureter. The stone was identified and I felt it was too large to extract and therefore elected to proceed with laser lithotripsy. The 200  holmium laser fiber was used to fragment the stone. I then used the nitinol basket to extract all of the stone fragments and reinspection of the ureter ureteroscopically revealed no further stone fragments and no injury to the ureter. I then backloaded the cystoscope over the guidewire and passed the stent over the guidewire into the area of the renal pelvis. As the guidewire was removed good curl was noted in the renal pelvis. The bladder was drained and the cystoscope was then removed. The patient tolerated the procedure well no intraoperative complications.  PLAN OF CARE: Discharge to home after PACU  PATIENT DISPOSITION:  PACU - hemodynamically stable.

## 2011-11-13 NOTE — Anesthesia Procedure Notes (Signed)
Procedure Name: LMA Insertion Date/Time: 11/13/2011 7:35 AM Performed by: Norva Pavlov Pre-anesthesia Checklist: Patient identified, Emergency Drugs available, Suction available and Patient being monitored Patient Re-evaluated:Patient Re-evaluated prior to inductionOxygen Delivery Method: Circle System Utilized Preoxygenation: Pre-oxygenation with 100% oxygen Intubation Type: IV induction Ventilation: Mask ventilation without difficulty LMA: LMA inserted LMA Size: 4.0 Number of attempts: 1 Airway Equipment and Method: bite block Placement Confirmation: positive ETCO2 Tube secured with: Tape Dental Injury: Teeth and Oropharynx as per pre-operative assessment

## 2011-11-13 NOTE — Transfer of Care (Signed)
Immediate Anesthesia Transfer of Care Note  Patient: Jeremy [name]  Procedure(s) Performed: Procedure(s) (LRB): URETEROSCOPY (Right) HOLMIUM LASER APPLICATION (Right)  Patient Location: PACU  Anesthesia Type: General  Level of Consciousness: awake, alert  and oriented  Airway & Oxygen Therapy: Patient Spontanous Breathing and Patient connected to face mask oxygen  Post-op Assessment: Report given to PACU RN and Post -op Vital signs reviewed and stable  Post vital signs: Reviewed and stable  Complications: No apparent anesthesia complications

## 2011-11-13 NOTE — Anesthesia Postprocedure Evaluation (Signed)
Anesthesia Post Note  Patient: Jeremy [name]  Procedure(s) Performed: Procedure(s) (LRB): URETEROSCOPY (Right) HOLMIUM LASER APPLICATION (Right)  Anesthesia type: General  Patient location: PACU  Post pain: Pain level controlled  Post assessment: Post-op Vital signs reviewed  Last Vitals: BP 128/81  Pulse 86  Temp 36.1 C (Oral)  Resp 10  Ht 5\' 8"  (1.727 m)  Wt 201 lb (91.173 kg)  BMI 30.56 kg/m2  SpO2 100%  Post vital signs: Reviewed  Level of consciousness: sedated  Complications: No apparent anesthesia complications

## 2011-11-13 NOTE — Interval H&P Note (Signed)
History and Physical Interval Note:  11/13/2011 7:27 AM  Jeremy [name]  has presented today for surgery, with the diagnosis of Right Ureteral Stone  The various methods of treatment have been discussed with the patient and family. After consideration of risks, benefits and other options for treatment, the patient has consented to  Procedure(s) (LRB) with comments: URETEROSCOPY (Right) - 1 hour requested for this case  C-ARM CAMERA  HOLMIUM LASER APPLICATION (Right) as a surgical intervention .  The patient's history has been reviewed, patient examined, no change in status, stable for surgery.  I have reviewed the patient's chart and labs.  Questions were answered to the patient's satisfaction.     Garnett Farm

## 2011-11-14 ENCOUNTER — Encounter (HOSPITAL_BASED_OUTPATIENT_CLINIC_OR_DEPARTMENT_OTHER): Payer: Self-pay | Admitting: Urology

## 2011-11-16 ENCOUNTER — Encounter (HOSPITAL_BASED_OUTPATIENT_CLINIC_OR_DEPARTMENT_OTHER): Payer: Self-pay

## 2012-10-18 IMAGING — CR DG ABDOMEN 1V
2 series · 2 of 2 positions shown · non-contrast
Comparison: 09/26/2011

CLINICAL DATA: Preoperative examination (right ureteral stone)

ABDOMEN - 1 VIEW

[t abdomen supine (1 of 2)]
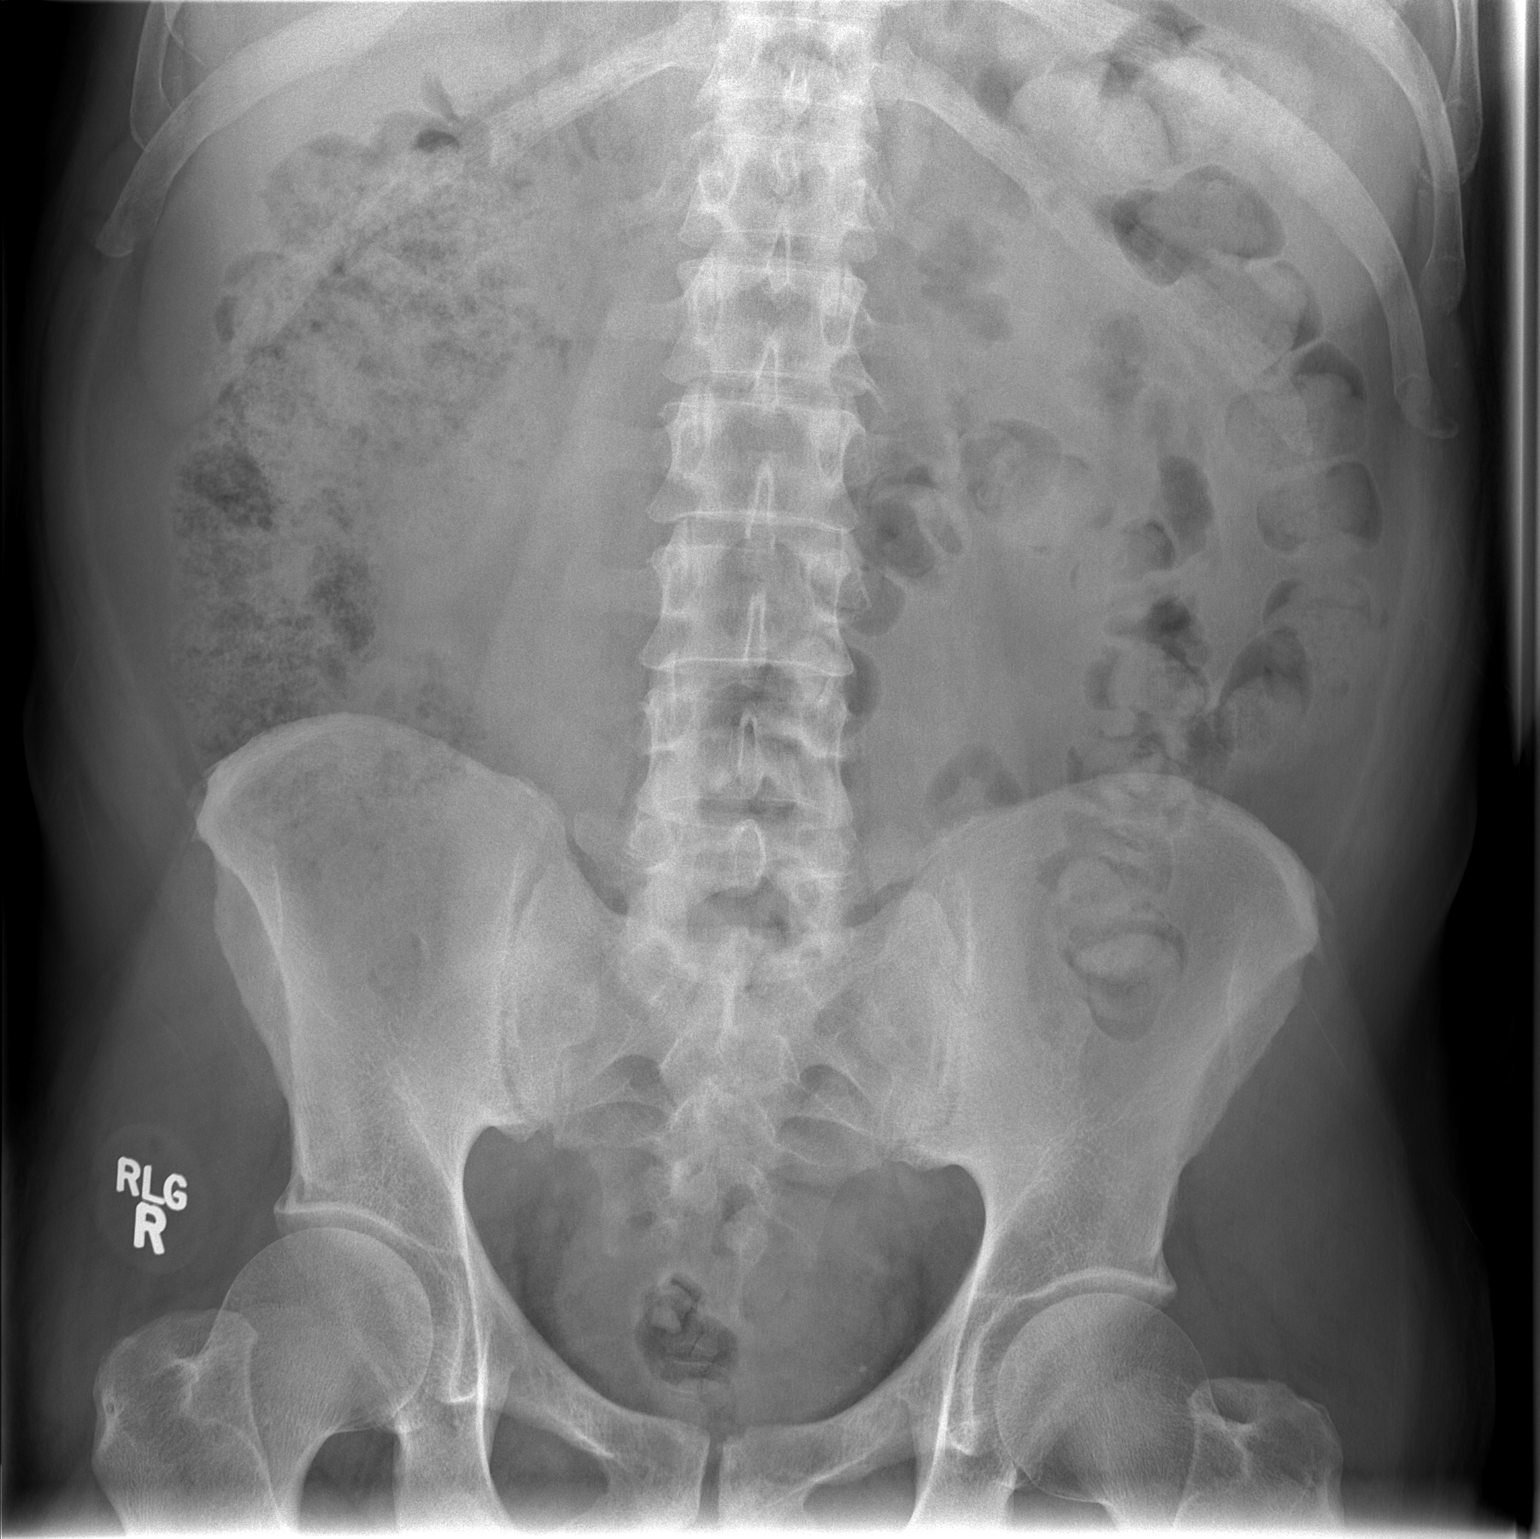

[t abdomen supine (2 of 2)]
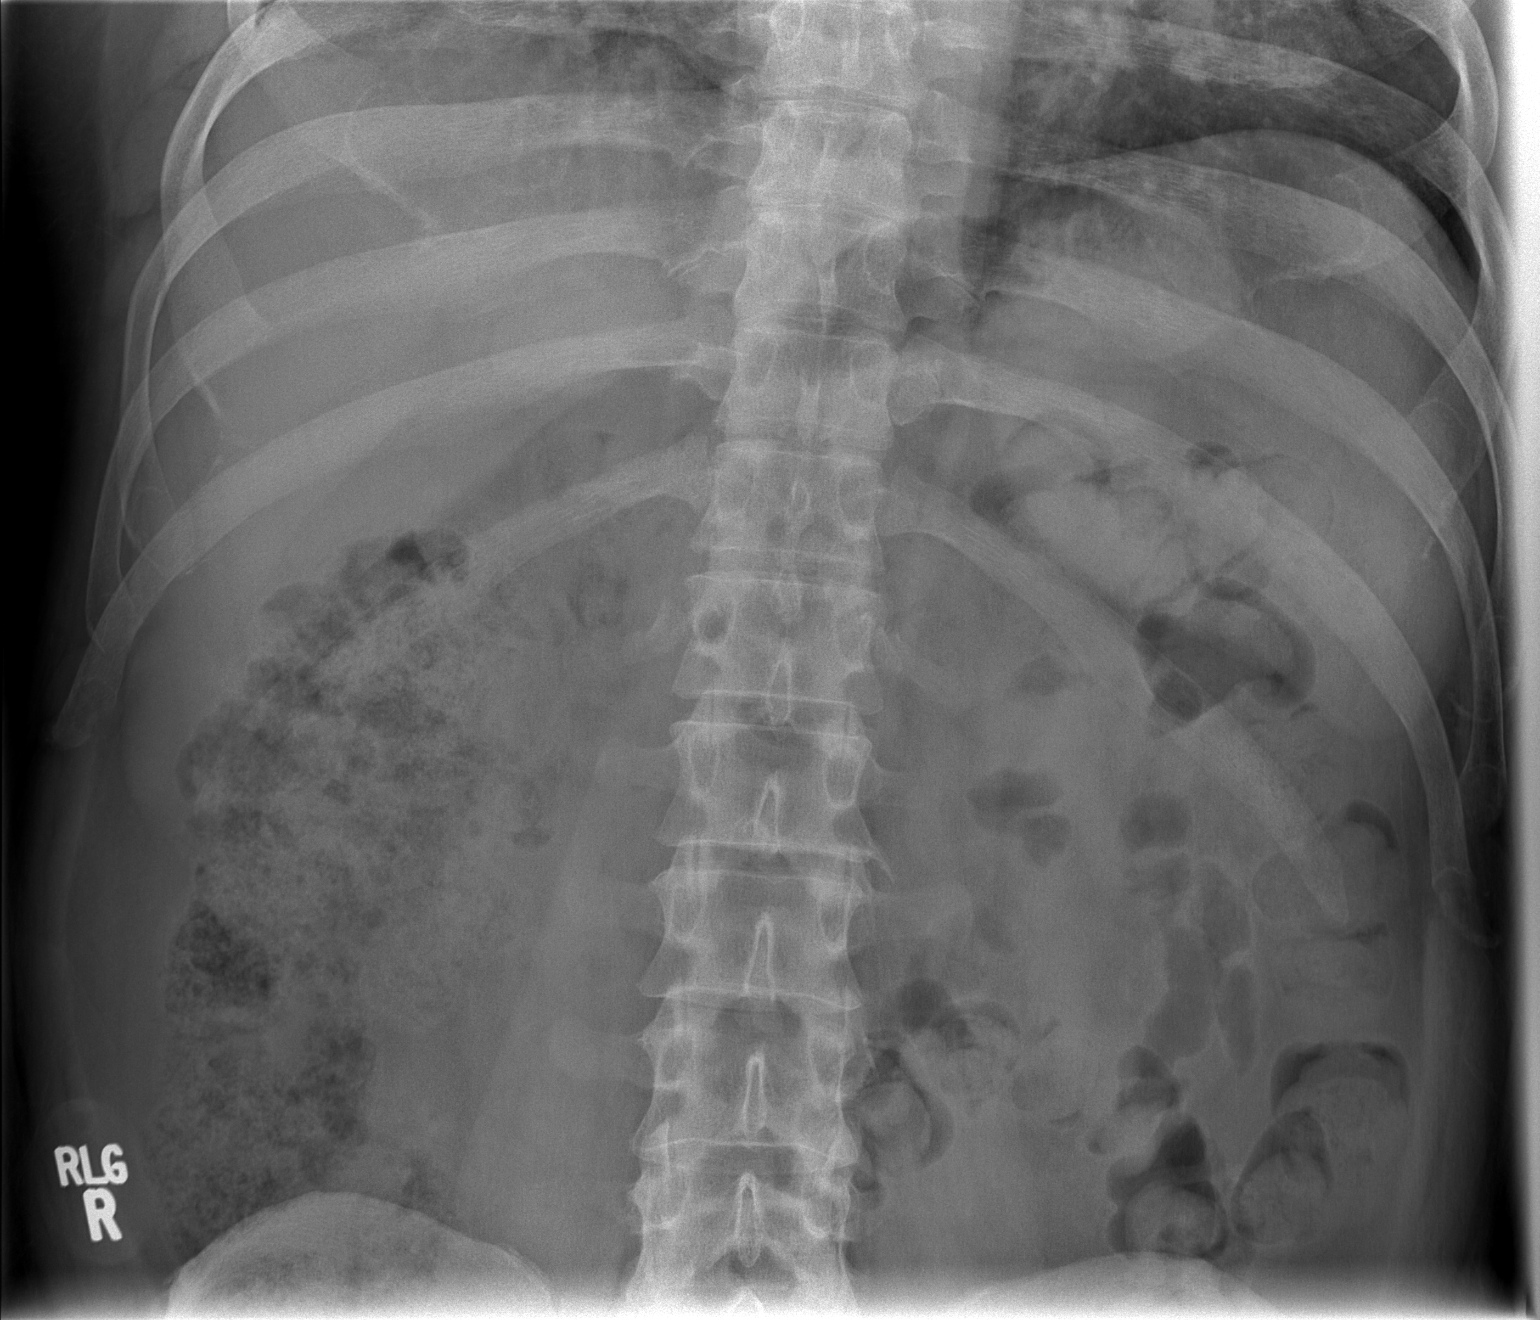

[2 of 2 positions shown; findings below may reference images not displayed]

FINDINGS: There is a grossly unchanged approximately 1.0 x 0.6 cm opacity
overlying the expected location of the right UVJ which likely
correlates with the provided history of right ureteral stone.
Several tiny phleboliths overlying the left hemi pelvis.

No additional stones overlie the expected location of either
kidney, though note, evaluation is somewhat degraded secondary to
overlying colonic stool burden.

Moderate colonic stool without evidence of obstruction.

No acute osseous abnormalities.
IMPRESSION: Grossly unchanged approximately 1.0 x 0.6 cm opacity overlying the
expected location of the right UVJ likely correlates with provided
history of ureteral stone.

## 2013-08-28 ENCOUNTER — Emergency Department (HOSPITAL_COMMUNITY)
Admission: EM | Admit: 2013-08-28 | Discharge: 2013-08-28 | Disposition: A | Discharge status: Home / Self Care | Payer: 59 | Attending: Emergency Medicine | Admitting: Emergency Medicine

## 2013-08-28 ENCOUNTER — Encounter (HOSPITAL_COMMUNITY): Payer: Self-pay | Admitting: Emergency Medicine

## 2013-08-28 DIAGNOSIS — I2510 Atherosclerotic heart disease of native coronary artery without angina pectoris: Secondary | ICD-10-CM | POA: Diagnosis not present

## 2013-08-28 DIAGNOSIS — Z7982 Long term (current) use of aspirin: Secondary | ICD-10-CM | POA: Insufficient documentation

## 2013-08-28 DIAGNOSIS — Z9861 Coronary angioplasty status: Secondary | ICD-10-CM | POA: Insufficient documentation

## 2013-08-28 DIAGNOSIS — Y929 Unspecified place or not applicable: Secondary | ICD-10-CM | POA: Insufficient documentation

## 2013-08-28 DIAGNOSIS — S335XXA Sprain of ligaments of lumbar spine, initial encounter: Secondary | ICD-10-CM | POA: Insufficient documentation

## 2013-08-28 DIAGNOSIS — I10 Essential (primary) hypertension: Secondary | ICD-10-CM | POA: Insufficient documentation

## 2013-08-28 DIAGNOSIS — Z87442 Personal history of urinary calculi: Secondary | ICD-10-CM | POA: Diagnosis not present

## 2013-08-28 DIAGNOSIS — X500XXA Overexertion from strenuous movement or load, initial encounter: Secondary | ICD-10-CM | POA: Insufficient documentation

## 2013-08-28 DIAGNOSIS — Y9389 Activity, other specified: Secondary | ICD-10-CM | POA: Insufficient documentation

## 2013-08-28 DIAGNOSIS — E785 Hyperlipidemia, unspecified: Secondary | ICD-10-CM | POA: Insufficient documentation

## 2013-08-28 DIAGNOSIS — I252 Old myocardial infarction: Secondary | ICD-10-CM | POA: Insufficient documentation

## 2013-08-28 DIAGNOSIS — Z79899 Other long term (current) drug therapy: Secondary | ICD-10-CM | POA: Insufficient documentation

## 2013-08-28 DIAGNOSIS — Z87891 Personal history of nicotine dependence: Secondary | ICD-10-CM | POA: Diagnosis not present

## 2013-08-28 DIAGNOSIS — IMO0002 Reserved for concepts with insufficient information to code with codable children: Secondary | ICD-10-CM | POA: Diagnosis present

## 2013-08-28 DIAGNOSIS — S39012A Strain of muscle, fascia and tendon of lower back, initial encounter: Secondary | ICD-10-CM

## 2013-08-28 MED ORDER — HYDROCODONE-ACETAMINOPHEN 5-325 MG PO TABS: 2.0000 | ORAL_TABLET | ORAL | Status: DC | PRN

## 2013-08-28 NOTE — Discharge Instructions (Signed)
Return to the emergency room if symptoms worsen or with symptoms that are concerning. Take Norco at night PRN for severe pain. Do not operate machinery or drive while taking Norco or any narcotic.  Back Exercises Back exercises help treat and prevent back injuries. The goal of back exercises is to increase the strength of your abdominal and back muscles and the flexibility of your back. These exercises should be started when you no longer have back pain. Back exercises include:  Pelvic Tilt. Lie on your back with your knees bent. Tilt your pelvis until the lower part of your back is against the floor. Hold this position 5 to 10 sec and repeat 5 to 10 times.  Knee to Chest. Pull first 1 knee up against your chest and hold for 20 to 30 seconds, repeat this with the other knee, and then both knees. This may be done with the other leg straight or bent, whichever feels better.  Sit-Ups or Curl-Ups. Bend your knees 90 degrees. Start with tilting your pelvis, and do a partial, slow sit-up, lifting your trunk only 30 to 45 degrees off the floor. Take at least 2 to 3 seconds for each sit-up. Do not do sit-ups with your knees out straight. If partial sit-ups are difficult, simply do the above but with only tightening your abdominal muscles and holding it as directed.  Hip-Lift. Lie on your back with your knees flexed 90 degrees. Push down with your feet and shoulders as you raise your hips a couple inches off the floor; hold for 10 seconds, repeat 5 to 10 times.  Back arches. Lie on your stomach, propping yourself up on bent elbows. Slowly press on your hands, causing an arch in your low back. Repeat 3 to 5 times. Any initial stiffness and discomfort should lessen with repetition over time.  Shoulder-Lifts. Lie face down with arms beside your body. Keep hips and torso pressed to floor as you slowly lift your head and shoulders off the floor. Do not overdo your exercises, especially in the beginning. Exercises  may cause you some mild back discomfort which lasts for a few minutes; however, if the pain is more severe, or lasts for more than 15 minutes, do not continue exercises until you see your caregiver. Improvement with exercise therapy for back problems is slow.  See your caregivers for assistance with developing a proper back exercise program. Document Released: 02/24/2004 Document Revised: 04/10/2011 Document Reviewed: 11/17/2010 [facility] Patient Information 2015 [place], [place]. This information is not intended to replace advice given to you by your health care provider. Make sure you discuss any questions you have with your health care provider.   Back Pain, Adult Low back pain is very common. About 1 in 5 people have back pain.The cause of low back pain is rarely dangerous. The pain often gets better over time.About half of people with a sudden onset of back pain feel better in just 2 weeks. About 8 in 10 people feel better by 6 weeks.  CAUSES Some common causes of back pain include:  Strain of the muscles or ligaments supporting the spine.  Wear and tear (degeneration) of the spinal discs.  Arthritis.  Direct injury to the back. DIAGNOSIS Most of the time, the direct cause of low back pain is not known.However, back pain can be treated effectively even when the exact cause of the pain is unknown.Answering your caregiver's questions about your overall health and symptoms is one of the most accurate ways to make  sure the cause of your pain is not dangerous. If your caregiver needs more information, he or she may order lab work or imaging tests (X-rays or MRIs).However, even if imaging tests show changes in your back, this usually does not require surgery. HOME CARE INSTRUCTIONS For many people, back pain returns.Since low back pain is rarely dangerous, it is often a condition that people can learn to [facility] their own.   Remain active. It is stressful on the back to sit or stand in  one place. Do not sit, drive, or stand in one place for more than 30 minutes at a time. Take short walks on level surfaces as soon as pain allows.Try to increase the length of time you walk each day.  Do not stay in bed.Resting more than 1 or 2 days can delay your recovery.  Do not avoid exercise or work.Your body is made to move.It is not dangerous to be active, even though your back may hurt.Your back will likely heal faster if you return to being active before your pain is gone.  Pay attention to your body when you bend and lift. Many people have less discomfortwhen lifting if they bend their knees, keep the load close to their bodies,and avoid twisting. Often, the most comfortable positions are those that put less stress on your recovering back.  Find a comfortable position to sleep. Use a firm mattress and lie on your side with your knees slightly bent. If you lie on your back, put a pillow under your knees.  Only take over-the-counter or prescription medicines as directed by your caregiver. Over-the-counter medicines to reduce pain and inflammation are often the most helpful.Your caregiver may prescribe muscle relaxant drugs.These medicines help dull your pain so you can more quickly return to your normal activities and healthy exercise.  Put ice on the injured area.  Put ice in a plastic bag.  Place a towel between your skin and the bag.  Leave the ice on for 15-20 minutes, 03-04 times a day for the first 2 to 3 days. After that, ice and heat may be alternated to reduce pain and spasms.  Ask your caregiver about trying back exercises and gentle massage. This may be of some benefit.  Avoid feeling anxious or stressed.Stress increases muscle tension and can worsen back pain.It is important to recognize when you are anxious or stressed and learn ways to manage it.Exercise is a great option. SEEK MEDICAL CARE IF:  You have pain that is not relieved with rest or  medicine.  You have pain that does not improve in 1 week.  You have new symptoms.  You are generally not feeling well. SEEK IMMEDIATE MEDICAL CARE IF:   You have pain that radiates from your back into your legs.  You develop new bowel or bladder control problems.  You have unusual weakness or numbness in your arms or legs.  You develop nausea or vomiting.  You develop abdominal pain.  You feel faint. Document Released: 01/16/2005 Document Revised: 07/18/2011 Document Reviewed: 05/20/2013 [facility] Patient Information 2015 [place]. This information is not intended to replace advice given to you by your health care provider. Make sure you discuss any questions you have with your health care provider.

## 2013-08-28 NOTE — ED Provider Notes (Signed)
Medical screening examination/treatment/procedure(s) were performed by non-physician practitioner and as supervising physician I was immediately available for consultation/collaboration.   Jahmere Bramel, MD 08/28/13 0745 

## 2013-08-28 NOTE — ED Provider Notes (Signed)
CSN: [id number]     Arrival date & time 08/28/13  [id number] History   First MD Initiated Contact with Patient 08/28/13 0617     Chief Complaint  Patient presents with  . Back Pain     (Consider location/radiation/quality/duration/timing/severity/associated sxs/prior Treatment) Patient is a 53 y.o. male presenting with back pain. The history is provided by the patient.  Back Pain Location:  Gluteal region Quality:  Stabbing Radiates to:  Does not radiate Pain severity:  Moderate Onset quality:  Sudden Duration:  1 day Progression:  Improving Chronicity:  Chronic Relieved by: position changes: back flexion. Worsened by:  Coughing and lying down Associated symptoms: no abdominal pain, no bladder incontinence, no bowel incontinence, no chest pain, no dysuria, no fever, no leg pain, no numbness, no paresthesias, no perianal numbness, no tingling, no weakness and no weight loss   Associated symptoms comment:  No hematuria or other urinary symptoms. No N/V/D. No fever, chills, night sweats or recent illness.    Past Medical History  Diagnosis Date  . History of acute myocardial infarction 12-21-2006--  ACUTE INFEROPOSTERIOR WALL MI    S/P PTCA W/ DE STENTS TO RCA   . History of cocaine abuse   . Hypertension   . Hyperlipidemia   . Coronary artery disease CARDIOLOGIST-  DR Sharyn LullHARWANI-- LOV  MAY 2013    (11-08-2011 DENIES CARDIAC SYMPTOMS)  . Right ureteral stone    Past Surgical History  Procedure Laterality Date  . Coronary angioplasty with stent placement  12-18-2007  DR HARWANI    PTCA TO PROXIMAL RCA AND DRUG-ELUTING STENT IN PROXIMAL AND MID RCA  . Inguinal hernia repair      BILATERAL  . Cardiovascular stress test  04-26-2007  DR HARWANI    NORMAL STUDY/ NO ISCHEMIA  . Ureteroscopy  11/13/2011    Procedure: URETEROSCOPY;  Surgeon: Garnett FarmMark C Ottelin, MD;  [facility] [place];  Service: Urology;  Laterality: Right;  1 hour requested for this case  C-ARM [facility]     No family history on file. History  Substance Use Topics  . Smoking status: Former Smoker -- 1.00 packs/day for 15 years    Types: Cigarettes    Quit date: 12/18/2006  . Smokeless tobacco: Former NeurosurgeonUser  . Alcohol Use: Yes     Comment: HISTORY ALCOHOL ABUSE BEFORE 2008. ocassional now.    Review of Systems  Constitutional: Negative for fever, chills, weight loss and diaphoresis.  Respiratory: Positive for cough. Negative for shortness of breath, wheezing and stridor.   Cardiovascular: Negative for chest pain, palpitations and leg swelling.  Gastrointestinal: Negative for nausea, vomiting, abdominal pain, diarrhea, blood in stool, anal bleeding and bowel incontinence.  Genitourinary: Negative for bladder incontinence, dysuria, urgency, frequency, flank pain and decreased urine volume.  Musculoskeletal: Positive for back pain. Negative for gait problem.  Skin: Negative for color change, pallor, rash and wound.  Neurological: Negative for dizziness, tingling, speech difficulty, weakness, numbness and paresthesias.  Psychiatric/Behavioral: Negative for behavioral problems, confusion, decreased concentration and agitation.      Allergies  Review of patient's allergies indicates no known allergies.  Home Medications   Prior to Admission medications   Medication Sig Start Date End Date Taking? Authorizing Provider  aspirin EC 81 MG tablet Take 81 mg by mouth daily.   Yes Historical Provider, MD  atorvastatin (LIPITOR) 40 MG tablet Take 40 mg by mouth every morning.   Yes Historical Provider, MD  fish oil-omega-3 fatty acids 1000 MG capsule Take  2 g by mouth daily.   Yes Historical Provider, MD  metoprolol succinate (TOPROL-XL) 25 MG 24 hr tablet Take 25 mg by mouth every morning.   Yes Historical Provider, MD  vitamin C (ASCORBIC ACID) 500 MG tablet Take 500 mg by mouth daily.   Yes Historical Provider, MD  zolpidem (AMBIEN) 10 MG tablet Take 5 mg by mouth at bedtime as needed for  sleep.   Yes Historical Provider, MD  HYDROcodone-acetaminophen (NORCO/VICODIN) 5-325 MG per tablet Take 2 tablets by mouth every 4 (four) hours as needed for moderate pain or severe pain. 08/28/13   Benetta Spar L Kaeley Vinje, PA-C   BP 121/81  Pulse 95  Temp(Src) 98.2 F (36.8 C) (Oral)  Resp 20  SpO2 97% Physical Exam  Constitutional: He is oriented to person, place, and time. He appears well-developed and well-nourished. No distress.  HENT:  Head: Normocephalic and atraumatic.  Eyes: EOM are normal.  Neck: Normal range of motion. Neck supple.  Cardiovascular: Normal rate, regular rhythm, normal heart sounds and intact distal pulses.   Pulmonary/Chest: Effort normal. No respiratory distress. He has no wheezes. He has no rales. He exhibits no tenderness.  Decreased air movement  Abdominal: Soft. There is no tenderness. There is no rebound.  Musculoskeletal:       Lumbar back: He exhibits tenderness and pain. He exhibits normal range of motion, no bony tenderness, no swelling, no edema, no deformity, no laceration and normal pulse.  Localized pain in left lower back to left buttock. No midline or bony tenderness. Good ROM. Strength 5/5 in upper and lower extremities. Sensation intact. DTR in lower extremity intact. Negative straight leg test.  Neurological: He is alert and oriented to person, place, and time. He has normal reflexes. He displays normal reflexes. He exhibits normal muscle tone. Coordination normal.  Skin: Skin is warm and dry. He is not diaphoretic.  Psychiatric: He has a normal mood and affect. His behavior is normal.    ED Course  Procedures (including critical care time) Labs Review Labs Reviewed - No data to display  Imaging Review No results found.   EKG Interpretation None      MDM   Final diagnoses:  Strain of lumbar paraspinal muscle, initial encounter   Pt with chronic back pain with acute onset localized left sided low back pain after coughing. Symptoms  are improving.  NSAIDs for one week with Norco at night PRN. Advised on narcotic use precautions and patient voiced understanding and agrees.   Work on back strengthening  Patient with decreased air movement and is a former smoker with no dyspnea, wheezing, or rales. Follow up with primary care provider in 2 days. Discussed return precautions with patient. Patient verbalizes understanding and agrees with plan.      Louann Sjogren, PA-C 08/28/13 [phone]

## 2013-08-28 NOTE — ED Notes (Addendum)
Pt states that he coughed twice yesterday and since he has been experiencing lower back pain. States that he tried some exercises and it did help a little. States that the pain has gotten better but came in to make sure that its nothing serious. Pt states that he did have a few drinks of alcohol last night. Pt requesting to be treated without pain medications and specifically without narcotics due to a hx of abuse.

## 2014-03-25 ENCOUNTER — Other Ambulatory Visit: Payer: Self-pay | Admitting: Family Medicine

## 2014-03-25 DIAGNOSIS — G4489 Other headache syndrome: Secondary | ICD-10-CM

## 2014-04-10 ENCOUNTER — Other Ambulatory Visit: Payer: Self-pay

## 2018-09-28 ENCOUNTER — Encounter (HOSPITAL_COMMUNITY): Payer: Self-pay | Admitting: Emergency Medicine

## 2018-09-28 ENCOUNTER — Other Ambulatory Visit: Payer: Self-pay

## 2018-09-28 ENCOUNTER — Emergency Department (HOSPITAL_COMMUNITY)
Admission: EM | Admit: 2018-09-28 | Discharge: 2018-09-28 | Disposition: A | Discharge status: Home / Self Care | Payer: No Typology Code available for payment source | Attending: Emergency Medicine | Admitting: Emergency Medicine

## 2018-09-28 DIAGNOSIS — Z79899 Other long term (current) drug therapy: Secondary | ICD-10-CM | POA: Diagnosis not present

## 2018-09-28 DIAGNOSIS — I2510 Atherosclerotic heart disease of native coronary artery without angina pectoris: Secondary | ICD-10-CM | POA: Insufficient documentation

## 2018-09-28 DIAGNOSIS — Z87891 Personal history of nicotine dependence: Secondary | ICD-10-CM | POA: Diagnosis not present

## 2018-09-28 DIAGNOSIS — Z955 Presence of coronary angioplasty implant and graft: Secondary | ICD-10-CM | POA: Insufficient documentation

## 2018-09-28 DIAGNOSIS — H5711 Ocular pain, right eye: Secondary | ICD-10-CM | POA: Diagnosis not present

## 2018-09-28 DIAGNOSIS — Z7982 Long term (current) use of aspirin: Secondary | ICD-10-CM | POA: Insufficient documentation

## 2018-09-28 MED ORDER — TETRACAINE HCL 0.5 % OP SOLN
2.0000 [drp] | Freq: Once | OPHTHALMIC | Status: AC
  Administered 2018-09-28: 2 [drp] via OPHTHALMIC
  Filled 2018-09-28: qty 4

## 2018-09-28 MED ORDER — FLUORESCEIN SODIUM 1 MG OP STRP
1.0000 | ORAL_STRIP | Freq: Once | OPHTHALMIC | Status: AC
  Administered 2018-09-28: 12:00:00 1 via OPHTHALMIC
  Filled 2018-09-28: qty 1

## 2018-09-28 MED ORDER — ERYTHROMYCIN 5 MG/GM OP OINT: TOPICAL_OINTMENT | OPHTHALMIC | 0 refills | Status: DC

## 2018-09-28 NOTE — ED Triage Notes (Signed)
Pt reports waking up with something in his eye. Pt not sure of what it could be but has pain and feels as though it is moving around.

## 2018-09-28 NOTE — ED Notes (Signed)
Pt R eye irrigated with 350 ns using morgan lense.  Tolerated well.

## 2018-09-28 NOTE — ED Provider Notes (Signed)
[name] EMERGENCY DEPARTMENT Provider Note   CSN: [id number] Arrival date & time: 09/28/18  1022     History   Chief Complaint Chief Complaint  Patient presents with  . Object in Eye    HPI Jeremy [name] is a 58 y.o. male who presents with right eye pain and irritation that began last night.  He states that when he went to bed, he noticed some irritation in his eye but when he woke up this morning, is significantly worse.  He feels like something is scratching in his eye in his upper eyelid.  He does not recall doing anything yesterday that would cause a foreign body in the eye.  He states he was not welding or doing any woodwork.  He denies any trauma to the eye.  He wears reading glasses but denies any contacts.  Reports associated photophobia.  He has noticed some redness in the eye also but used Visine tears earlier today which helped.  He has not had any blurry vision, swelling around the face, fevers.     The history is provided by the patient.    Past Medical History:  Diagnosis Date  . Coronary artery disease CARDIOLOGIST-  DR Sharyn Lull-- LOV  MAY 2013   (11-08-2011 DENIES CARDIAC SYMPTOMS)  . History of acute myocardial infarction 12-21-2006--  ACUTE INFEROPOSTERIOR WALL MI   S/P PTCA W/ DE STENTS TO RCA   . History of cocaine abuse (HCC)   . Hyperlipidemia   . Hypertension   . Right ureteral stone     There are no active problems to display for this patient.   Past Surgical History:  Procedure Laterality Date  . CARDIOVASCULAR STRESS TEST  04-26-2007  DR HARWANI   NORMAL STUDY/ NO ISCHEMIA  . CORONARY ANGIOPLASTY WITH STENT PLACEMENT  12-18-2007  DR HARWANI   PTCA TO PROXIMAL RCA AND DRUG-ELUTING STENT IN PROXIMAL AND MID RCA  . INGUINAL HERNIA REPAIR     BILATERAL  . URETEROSCOPY  11/13/2011   Procedure: URETEROSCOPY;  Surgeon: Garnett Farm, MD;  Location: [facility];  Service: Urology;  Laterality: Right;  1 hour  requested for this case  C-ARM CAMERA         Home Medications    Prior to Admission medications   Medication Sig Start Date End Date Taking? Authorizing Provider  aspirin EC 81 MG tablet Take 81 mg by mouth daily.    [provider]  atorvastatin (LIPITOR) 40 MG tablet Take 40 mg by mouth every morning.    [provider]  erythromycin ophthalmic ointment Place a 1/2 inch ribbon of ointment into the lower eyelid. 09/28/18   Maxwell Caul, PA-C  fish oil-omega-3 fatty acids 1000 MG capsule Take 2 g by mouth daily.    [provider]  HYDROcodone-acetaminophen (NORCO/VICODIN) 5-325 MG per tablet Take 2 tablets by mouth every 4 (four) hours as needed for moderate pain or severe pain. 08/28/13   Oswaldo Conroy, PA-C  metoprolol succinate (TOPROL-XL) 25 MG 24 hr tablet Take 25 mg by mouth every morning.    [provider]  vitamin C (ASCORBIC ACID) 500 MG tablet Take 500 mg by mouth daily.    [provider]  zolpidem (AMBIEN) 10 MG tablet Take 5 mg by mouth at bedtime as needed for sleep.    [provider]    Family History No family history on file.  Social History Social History   Tobacco Use  .  Smoking status: Former Smoker    Packs/day: 1.00    Years: 15.00    Pack years: 15.00    Types: Cigarettes    Quit date: 12/18/2006    Years since quitting: 11.7  . Smokeless tobacco: Former Network engineer Use Topics  . Alcohol use: Yes    Comment: HISTORY ALCOHOL ABUSE BEFORE 2008. ocassional now.  . Drug use: Not on file    Comment: HISTORY COCAINE USE PER ECHART DOCUMENTATION ([place] 2008)     Allergies   Patient has no known allergies.   Review of Systems Review of Systems  Constitutional: Negative for fever.  HENT: Negative for facial swelling.   Eyes: Positive for photophobia, pain and redness. Negative for visual disturbance.  All other systems reviewed and are negative.     Physical Exam Updated Vital Signs BP 128/71 (BP Location: Right Arm)   Pulse (!) 52   Temp 97.9 F (36.6 C) (Oral)   Resp 18   Ht 5\' 9"  (1.753 m)   Wt 77.9 kg   SpO2 98%   BMI 25.37 kg/m   Physical Exam Vitals signs and nursing note reviewed.  Constitutional:      Appearance: He is well-developed.  HENT:     Head: Normocephalic and atraumatic.  Eyes:     General: No scleral icterus.       Right eye: No discharge.        Left eye: No discharge.     Conjunctiva/sclera:     Right eye: Right conjunctiva is injected.     Comments: PERRL. EOMs intact without any difficulty.  Slight conjunctival injection noted in the right eye.  No obvious foreign body noted on funduscopic exam.  No consensual pain.  No swelling, warmth, erythema surrounding the bilateral periorbital regions.  Pulmonary:     Effort: Pulmonary effort is normal.  Skin:    General: Skin is warm and dry.  Neurological:     Mental Status: He is alert.  Psychiatric:        Speech: Speech normal.        Behavior: Behavior normal.      ED Treatments / Results  Labs (all labs ordered are listed, but only abnormal results are displayed) Labs Reviewed - No data to display  EKG None  Radiology No results found.  Procedures Procedures (including critical care time)  Medications Ordered in ED Medications  tetracaine (PONTOCAINE) 0.5 % ophthalmic solution 2 drop (2 drops Both Eyes Given 09/28/18 1207)  fluorescein ophthalmic strip 1 strip (1 strip Both Eyes Given 09/28/18 1207)     Initial Impression / Assessment and Plan / ED Course  I have reviewed the triage vital signs and the nursing notes.  Pertinent labs & imaging results that were available during my care of the patient were reviewed by me and considered in my medical decision making (see chart for details).         58 year old male who presents for evaluation of right eye pain that began last night.  Does not recall any trauma, injury to the  eye.  Feels like something is in it.  Does not wear contacts or glasses.  No fevers. Patient is afebrile, non-toxic appearing, sitting comfortably on examination table. Vital signs reviewed and stable.  Consider corneal abrasion versus conjunctivitis.  History/physical exam not concerning for preseptal or orbital cellulitis.  Plan to check visual acuity, IOP's, evaluation with Woods lamp.  Woods lamp shows no evidence  of fluorescein uptake, corneal abrasion, dendritic lesion.  Unfortunately this time, we do not have any slit-lamp that I would be able to evaluate patient.  Intraocular pressure as documented below:  Left IOP: 13, 11, 11 Right IOP: 11, 12, 11      Visual Acuity  Right Eye Distance: 20/32 Left Eye Distance: 20/20 Bilateral Distance: 20/16  Exam not concerning for glaucoma, iritis.  We will plan to flush eyes [facility].  Reevaluation after eyes flush with Lequita HaltMorgan lens.  He states it feels slightly better.  Reevaluation with lid inverted.  I do not see any evidence of foreign body.  Will plan to treat as corneal abrasion with erythromycin ointment.  Discussed patient with Dr. Sherryll BurgerShah (Ophthalmology).  Please with plan.  We will follow-up with patient on Monday.  Updated patient on plan. At this time, patient exhibits no emergent life-threatening condition that require further evaluation in ED or admission. Patient had ample opportunity for questions and discussion. All patient's questions were answered with full understanding. Strict return precautions discussed. Patient expresses understanding and agreement to plan.   Portions of this note were generated with Scientist, clinical (histocompatibility and immunogenetics)Dragon dictation software. Dictation errors may occur despite best attempts at proofreading.   Final Clinical Impressions(s) / ED Diagnoses   Final diagnoses:  Eye pain, right    ED Discharge Orders         Ordered    erythromycin ophthalmic ointment     09/28/18 1224           Maxwell CaulLayden,  A, PA-C  09/28/18 1541    Tegeler, Canary Brimhristopher J, MD 09/28/18 [phone]

## 2018-09-28 NOTE — Discharge Instructions (Signed)
Use antibiotic ointment as directed.  As we discussed, please follow-up with the ophthalmologist on Monday if there is no improvements.  Call their office and arrange for an appointment.  Return the emergency department for any fever, vision changes, worsening pain or any other worsening or concerning symptoms.

## 2019-10-18 ENCOUNTER — Other Ambulatory Visit: Payer: Self-pay

## 2019-10-18 ENCOUNTER — Inpatient Hospital Stay (HOSPITAL_COMMUNITY)
Admission: EM | Admit: 2019-10-18 | Discharge: 2019-10-21 | DRG: 247 | Disposition: A | Discharge status: Home / Self Care | Payer: PRIVATE HEALTH INSURANCE | Attending: Cardiovascular Disease | Admitting: Cardiovascular Disease

## 2019-10-18 ENCOUNTER — Encounter (HOSPITAL_COMMUNITY)
Admission: EM | Disposition: A | Discharge status: Home / Self Care | Payer: Self-pay | Source: Home / Self Care | Attending: Cardiovascular Disease

## 2019-10-18 ENCOUNTER — Encounter (HOSPITAL_COMMUNITY): Payer: Self-pay | Admitting: Emergency Medicine

## 2019-10-18 ENCOUNTER — Emergency Department (HOSPITAL_COMMUNITY): Payer: PRIVATE HEALTH INSURANCE

## 2019-10-18 DIAGNOSIS — F1411 Cocaine abuse, in remission: Secondary | ICD-10-CM | POA: Diagnosis present

## 2019-10-18 DIAGNOSIS — I255 Ischemic cardiomyopathy: Secondary | ICD-10-CM | POA: Diagnosis present

## 2019-10-18 DIAGNOSIS — I119 Hypertensive heart disease without heart failure: Secondary | ICD-10-CM | POA: Diagnosis present

## 2019-10-18 DIAGNOSIS — I2109 ST elevation (STEMI) myocardial infarction involving other coronary artery of anterior wall: Secondary | ICD-10-CM | POA: Diagnosis present

## 2019-10-18 DIAGNOSIS — E782 Mixed hyperlipidemia: Secondary | ICD-10-CM | POA: Diagnosis present

## 2019-10-18 DIAGNOSIS — I213 ST elevation (STEMI) myocardial infarction of unspecified site: Secondary | ICD-10-CM

## 2019-10-18 DIAGNOSIS — Z20822 Contact with and (suspected) exposure to covid-19: Secondary | ICD-10-CM | POA: Diagnosis present

## 2019-10-18 DIAGNOSIS — Z7982 Long term (current) use of aspirin: Secondary | ICD-10-CM

## 2019-10-18 DIAGNOSIS — I10 Essential (primary) hypertension: Secondary | ICD-10-CM

## 2019-10-18 DIAGNOSIS — I2119 ST elevation (STEMI) myocardial infarction involving other coronary artery of inferior wall: Secondary | ICD-10-CM | POA: Diagnosis not present

## 2019-10-18 DIAGNOSIS — I2102 ST elevation (STEMI) myocardial infarction involving left anterior descending coronary artery: Principal | ICD-10-CM | POA: Diagnosis present

## 2019-10-18 DIAGNOSIS — I2584 Coronary atherosclerosis due to calcified coronary lesion: Secondary | ICD-10-CM | POA: Diagnosis present

## 2019-10-18 DIAGNOSIS — E785 Hyperlipidemia, unspecified: Secondary | ICD-10-CM

## 2019-10-18 DIAGNOSIS — T82855A Stenosis of coronary artery stent, initial encounter: Secondary | ICD-10-CM | POA: Diagnosis present

## 2019-10-18 DIAGNOSIS — M25511 Pain in right shoulder: Secondary | ICD-10-CM

## 2019-10-18 DIAGNOSIS — Z955 Presence of coronary angioplasty implant and graft: Secondary | ICD-10-CM

## 2019-10-18 DIAGNOSIS — I252 Old myocardial infarction: Secondary | ICD-10-CM

## 2019-10-18 DIAGNOSIS — Z79899 Other long term (current) drug therapy: Secondary | ICD-10-CM

## 2019-10-18 DIAGNOSIS — I25110 Atherosclerotic heart disease of native coronary artery with unstable angina pectoris: Secondary | ICD-10-CM | POA: Diagnosis not present

## 2019-10-18 DIAGNOSIS — Z87891 Personal history of nicotine dependence: Secondary | ICD-10-CM

## 2019-10-18 HISTORY — DX: ST elevation (STEMI) myocardial infarction involving other coronary artery of anterior wall: I21.09

## 2019-10-18 HISTORY — PX: LEFT HEART CATH AND CORONARY ANGIOGRAPHY: CATH118249

## 2019-10-18 HISTORY — PX: CORONARY/GRAFT ACUTE MI REVASCULARIZATION: CATH118305

## 2019-10-18 LAB — LIPID PANEL
Cholesterol: 151 mg/dL (ref 0–200)
HDL: 39 mg/dL — ABNORMAL LOW (ref >40)
LDL Cholesterol: 96 mg/dL (ref 0–99)
Total CHOL/HDL Ratio: 3.9 RATIO
Triglycerides: 79 mg/dL (ref <150)
VLDL: 16 mg/dL (ref 0–40)

## 2019-10-18 LAB — HEPATIC FUNCTION PANEL
ALT: 38 U/L (ref 0–44)
AST: 29 U/L (ref 15–41)
Albumin: 4.5 g/dL (ref 3.5–5.0)
Alkaline Phosphatase: 62 U/L (ref 38–126)
Bilirubin, Direct: <0.1 mg/dL (ref 0.0–0.2)
Total Bilirubin: 0.9 mg/dL (ref 0.3–1.2)
Total Protein: 7.2 g/dL (ref 6.5–8.1)

## 2019-10-18 LAB — TROPONIN I (HIGH SENSITIVITY)
Troponin I (High Sensitivity): 22 ng/L — ABNORMAL HIGH (ref <18)
Troponin I (High Sensitivity): 17557 ng/L (ref <18)

## 2019-10-18 LAB — CBC
HCT: 45.3 % (ref 39.0–52.0)
Hemoglobin: 15.5 g/dL (ref 13.0–17.0)
MCH: 30 pg (ref 26.0–34.0)
MCHC: 34.2 g/dL (ref 30.0–36.0)
MCV: 87.8 fL (ref 80.0–100.0)
Platelets: 280 10*3/uL (ref 150–400)
RBC: 5.16 MIL/uL (ref 4.22–5.81)
RDW: 11.8 % (ref 11.5–15.5)
WBC: 9.7 10*3/uL (ref 4.0–10.5)
nRBC: 0 % (ref 0.0–0.2)

## 2019-10-18 LAB — BASIC METABOLIC PANEL
Anion gap: 11 (ref 5–15)
BUN: 25 mg/dL — ABNORMAL HIGH (ref 6–20)
CO2: 21 mmol/L — ABNORMAL LOW (ref 22–32)
Calcium: 9.8 mg/dL (ref 8.9–10.3)
Chloride: 108 mmol/L (ref 98–111)
Creatinine, Ser: 1.13 mg/dL (ref 0.61–1.24)
GFR calc Af Amer: >60 mL/min (ref >60)
GFR calc non Af Amer: >60 mL/min (ref >60)
Glucose, Bld: 110 mg/dL — ABNORMAL HIGH (ref 70–99)
Potassium: 4.2 mmol/L (ref 3.5–5.1)
Sodium: 140 mmol/L (ref 135–145)

## 2019-10-18 LAB — PROTIME-INR
INR: 1.1 (ref 0.8–1.2)
Prothrombin Time: 13.5 seconds (ref 11.4–15.2)

## 2019-10-18 LAB — I-STAT CHEM 8, ED
BUN: 28 mg/dL — ABNORMAL HIGH (ref 6–20)
Calcium, Ion: 1.18 mmol/L (ref 1.15–1.40)
Chloride: 107 mmol/L (ref 98–111)
Creatinine, Ser: 0.9 mg/dL (ref 0.61–1.24)
Glucose, Bld: 106 mg/dL — ABNORMAL HIGH (ref 70–99)
HCT: 43 % (ref 39.0–52.0)
Hemoglobin: 14.6 g/dL (ref 13.0–17.0)
Potassium: 3.9 mmol/L (ref 3.5–5.1)
Sodium: 142 mmol/L (ref 135–145)
TCO2: 21 mmol/L — ABNORMAL LOW (ref 22–32)

## 2019-10-18 LAB — HEMOGLOBIN A1C
Hgb A1c MFr Bld: 5.1 % (ref 4.8–5.6)
Mean Plasma Glucose: 99.67 mg/dL

## 2019-10-18 LAB — SARS CORONAVIRUS 2 BY RT PCR (HOSPITAL ORDER, PERFORMED IN ~~LOC~~ HOSPITAL LAB): SARS Coronavirus 2: NEGATIVE

## 2019-10-18 LAB — APTT: aPTT: 24 seconds (ref 24–36)

## 2019-10-18 LAB — MRSA PCR SCREENING: MRSA by PCR: NEGATIVE

## 2019-10-18 SURGERY — CORONARY/GRAFT ACUTE MI REVASCULARIZATION
Anesthesia: LOCAL

## 2019-10-18 MED ORDER — SODIUM CHLORIDE 0.9 % IV SOLN: INTRAVENOUS | Status: AC

## 2019-10-18 MED ORDER — TIROFIBAN HCL IN NACL 5-0.9 MG/100ML-% IV SOLN
INTRAVENOUS | Status: AC | PRN
  Administered 2019-10-18: 0.15 ug/kg/min via INTRAVENOUS

## 2019-10-18 MED ORDER — ASPIRIN 81 MG PO CHEW
CHEWABLE_TABLET | ORAL | Status: AC
  Administered 2019-10-18: 324 mg via ORAL
  Filled 2019-10-18: qty 4

## 2019-10-18 MED ORDER — OXYCODONE HCL 5 MG PO TABS
5.0000 mg | ORAL_TABLET | ORAL | Status: DC | PRN
  Administered 2019-10-19: 5 mg via ORAL
  Administered 2019-10-20: 10 mg via ORAL
  Administered 2019-10-20: 5 mg via ORAL
  Administered 2019-10-20: 10 mg via ORAL
  Administered 2019-10-21: 5 mg via ORAL
  Filled 2019-10-18 (×2): qty 2
  Filled 2019-10-18 (×3): qty 1

## 2019-10-18 MED ORDER — LIDOCAINE HCL (PF) 1 % IJ SOLN
INTRAMUSCULAR | Status: AC
  Filled 2019-10-18: qty 30

## 2019-10-18 MED ORDER — TIROFIBAN HCL IN NACL 5-0.9 MG/100ML-% IV SOLN: 0.1500 ug/kg/min | INTRAVENOUS | Status: AC

## 2019-10-18 MED ORDER — ONDANSETRON HCL 4 MG/2ML IJ SOLN
4.0000 mg | Freq: Four times a day (QID) | INTRAMUSCULAR | Status: DC | PRN
  Administered 2019-10-20: 4 mg via INTRAVENOUS
  Filled 2019-10-18: qty 2

## 2019-10-18 MED ORDER — VERAPAMIL HCL 2.5 MG/ML IV SOLN
INTRAVENOUS | Status: AC
  Filled 2019-10-18: qty 2

## 2019-10-18 MED ORDER — TIROFIBAN (AGGRASTAT) BOLUS VIA INFUSION
INTRAVENOUS | Status: DC | PRN
  Administered 2019-10-18: 1792.5 ug via INTRAVENOUS

## 2019-10-18 MED ORDER — TICAGRELOR 90 MG PO TABS
ORAL_TABLET | ORAL | Status: AC
  Filled 2019-10-18: qty 2

## 2019-10-18 MED ORDER — NITROGLYCERIN 1 MG/10 ML FOR IR/CATH LAB
INTRA_ARTERIAL | Status: DC | PRN
  Administered 2019-10-18: 200 ug via INTRACORONARY

## 2019-10-18 MED ORDER — SODIUM CHLORIDE 0.9 % IV SOLN: 250.0000 mL | INTRAVENOUS | Status: DC | PRN

## 2019-10-18 MED ORDER — HEPARIN (PORCINE) IN NACL 1000-0.9 UT/500ML-% IV SOLN
INTRAVENOUS | Status: DC | PRN
  Administered 2019-10-18 (×2): 500 mL

## 2019-10-18 MED ORDER — LIDOCAINE HCL (PF) 1 % IJ SOLN
INTRAMUSCULAR | Status: DC | PRN
  Administered 2019-10-18: 5 mL via SUBCUTANEOUS

## 2019-10-18 MED ORDER — SODIUM CHLORIDE 0.9% FLUSH
3.0000 mL | Freq: Two times a day (BID) | INTRAVENOUS | Status: DC
  Administered 2019-10-19 – 2019-10-20 (×3): 3 mL via INTRAVENOUS
  Administered 2019-10-20: 10 mL via INTRAVENOUS

## 2019-10-18 MED ORDER — ASPIRIN 81 MG PO CHEW: 324.0000 mg | CHEWABLE_TABLET | Freq: Once | ORAL | Status: AC

## 2019-10-18 MED ORDER — MIDAZOLAM HCL 2 MG/2ML IJ SOLN
INTRAMUSCULAR | Status: AC
  Filled 2019-10-18: qty 2

## 2019-10-18 MED ORDER — TICAGRELOR 90 MG PO TABS
90.0000 mg | ORAL_TABLET | Freq: Two times a day (BID) | ORAL | Status: DC
  Administered 2019-10-18 – 2019-10-21 (×6): 90 mg via ORAL
  Filled 2019-10-18 (×6): qty 1

## 2019-10-18 MED ORDER — HEPARIN (PORCINE) IN NACL 1000-0.9 UT/500ML-% IV SOLN
INTRAVENOUS | Status: AC
  Filled 2019-10-18: qty 1000

## 2019-10-18 MED ORDER — FENTANYL CITRATE (PF) 100 MCG/2ML IJ SOLN: 50.0000 ug | Freq: Once | INTRAMUSCULAR | Status: DC

## 2019-10-18 MED ORDER — VERAPAMIL HCL 2.5 MG/ML IV SOLN
INTRAVENOUS | Status: DC | PRN
  Administered 2019-10-18: 10 mL via INTRA_ARTERIAL

## 2019-10-18 MED ORDER — HYDRALAZINE HCL 20 MG/ML IJ SOLN: 10.0000 mg | INTRAMUSCULAR | Status: AC | PRN

## 2019-10-18 MED ORDER — IOHEXOL 350 MG/ML SOLN
INTRAVENOUS | Status: AC
  Filled 2019-10-18: qty 1

## 2019-10-18 MED ORDER — NITROGLYCERIN 1 MG/10 ML FOR IR/CATH LAB
INTRA_ARTERIAL | Status: AC
  Filled 2019-10-18: qty 10

## 2019-10-18 MED ORDER — LABETALOL HCL 5 MG/ML IV SOLN: 10.0000 mg | INTRAVENOUS | Status: AC | PRN

## 2019-10-18 MED ORDER — TIROFIBAN HCL IN NACL 5-0.9 MG/100ML-% IV SOLN
INTRAVENOUS | Status: AC
  Filled 2019-10-18: qty 100

## 2019-10-18 MED ORDER — FENTANYL CITRATE (PF) 100 MCG/2ML IJ SOLN
INTRAMUSCULAR | Status: DC | PRN
Start: 2019-10-18 — End: 2019-10-18
  Administered 2019-10-18 (×2): 25 ug via INTRAVENOUS

## 2019-10-18 MED ORDER — HEPARIN SODIUM (PORCINE) 1000 UNIT/ML IJ SOLN
INTRAMUSCULAR | Status: DC | PRN
  Administered 2019-10-18: 6000 [IU] via INTRAVENOUS
  Administered 2019-10-18: 4000 [IU] via INTRAVENOUS

## 2019-10-18 MED ORDER — METOPROLOL TARTRATE 25 MG PO TABS
25.0000 mg | ORAL_TABLET | Freq: Two times a day (BID) | ORAL | Status: DC
  Administered 2019-10-19 – 2019-10-20 (×2): 25 mg via ORAL
  Filled 2019-10-18 (×6): qty 1

## 2019-10-18 MED ORDER — ACETAMINOPHEN 325 MG PO TABS
650.0000 mg | ORAL_TABLET | ORAL | Status: DC | PRN
  Administered 2019-10-20: 650 mg via ORAL
  Filled 2019-10-18: qty 2

## 2019-10-18 MED ORDER — MIDAZOLAM HCL 2 MG/2ML IJ SOLN
INTRAMUSCULAR | Status: DC | PRN
  Administered 2019-10-18: 1 mg via INTRAVENOUS
  Administered 2019-10-18: 2 mg via INTRAVENOUS

## 2019-10-18 MED ORDER — FENTANYL CITRATE (PF) 100 MCG/2ML IJ SOLN
INTRAMUSCULAR | Status: AC
  Filled 2019-10-18: qty 2

## 2019-10-18 MED ORDER — IOHEXOL 350 MG/ML SOLN
INTRAVENOUS | Status: DC | PRN
  Administered 2019-10-18: 160 mL

## 2019-10-18 MED ORDER — HEPARIN SODIUM (PORCINE) 5000 UNIT/ML IJ SOLN
4000.0000 [IU] | Freq: Once | INTRAMUSCULAR | Status: AC
  Administered 2019-10-18: 4000 [IU] via INTRAVENOUS
  Filled 2019-10-18: qty 1

## 2019-10-18 MED ORDER — ASPIRIN EC 81 MG PO TBEC
81.0000 mg | DELAYED_RELEASE_TABLET | Freq: Every day | ORAL | Status: DC
  Administered 2019-10-19 – 2019-10-21 (×3): 81 mg via ORAL
  Filled 2019-10-18 (×3): qty 1

## 2019-10-18 MED ORDER — TICAGRELOR 90 MG PO TABS
ORAL_TABLET | ORAL | Status: DC | PRN
  Administered 2019-10-18: 180 mg via ORAL

## 2019-10-18 MED ORDER — SODIUM CHLORIDE 0.9% FLUSH: 3.0000 mL | INTRAVENOUS | Status: DC | PRN

## 2019-10-18 MED ORDER — ATORVASTATIN CALCIUM 80 MG PO TABS
80.0000 mg | ORAL_TABLET | Freq: Every day | ORAL | Status: DC
  Administered 2019-10-19 – 2019-10-21 (×3): 80 mg via ORAL
  Filled 2019-10-18 (×3): qty 1

## 2019-10-18 MED ORDER — HEPARIN SODIUM (PORCINE) 1000 UNIT/ML IJ SOLN
INTRAMUSCULAR | Status: AC
  Filled 2019-10-18: qty 1

## 2019-10-18 SURGICAL SUPPLY — 20 items
BALLN SAPPHIRE 2.0X15 (BALLOONS) ×2
BALLN SAPPHIRE ~~LOC~~ 3.0X18 (BALLOONS) ×2 IMPLANT
BALLOON SAPPHIRE 2.0X15 (BALLOONS) ×1 IMPLANT
CATH 5FR JL3.5 JR4 ANG PIG MP (CATHETERS) ×2 IMPLANT
CATH VISTA GUIDE 6FR JR4 (CATHETERS) ×2 IMPLANT
CATH VISTA GUIDE 6FR XBLAD3.5 (CATHETERS) ×2 IMPLANT
DEVICE RAD COMP TR BAND LRG (VASCULAR PRODUCTS) ×2 IMPLANT
GLIDESHEATH SLEND SS 6F .021 (SHEATH) ×2 IMPLANT
GUIDEWIRE INQWIRE 1.5J.035X260 (WIRE) ×1 IMPLANT
INQWIRE 1.5J .035X260CM (WIRE) ×2
KIT ENCORE 26 ADVANTAGE (KITS) ×2 IMPLANT
KIT HEART LEFT (KITS) ×2 IMPLANT
PACK CARDIAC CATHETERIZATION (CUSTOM PROCEDURE TRAY) ×2 IMPLANT
STENT SYNERGY XD 2.75X12 (Permanent Stent) ×1 IMPLANT
STENT SYNERGY XD 2.75X24 (Permanent Stent) ×1 IMPLANT
SYNERGY XD 2.75X12 (Permanent Stent) ×2 IMPLANT
SYNERGY XD 2.75X24 (Permanent Stent) ×2 IMPLANT
TRANSDUCER W/STOPCOCK (MISCELLANEOUS) ×2 IMPLANT
TUBING CIL FLEX 10 FLL-RA (TUBING) ×2 IMPLANT
WIRE COUGAR XT STRL 190CM (WIRE) ×2 IMPLANT

## 2019-10-18 NOTE — ED Provider Notes (Signed)
[facility] EMERGENCY DEPARTMENT Provider Note   CSN: [id number] Arrival date & time: 10/18/19  1509     History Chief Complaint  Patient presents with  . Chest Pain    Jeremy [name] is a 59 y.o. male.  HPI    59 year old male history of MI in 2008 presents today complaining of chest pain that began 1 hour prior to evaluation.  He was out in the yard working and began having substernal chest pain.  He did not take any medication for this.  He states that he has not had nitro refilled as he has not had use it in the past.  He has quit taking his aspirin.  He did take one half of a metoprolol.  Pain is currently 5 out of 10.  He had some diaphoresis but is unclear whether this was due to working out in the heat or because of the pain.  He is having ongoing pain at this time.  He denies any dyspnea.  He has not had his Covid vaccine and has no known exposures. Past Medical History:  Diagnosis Date  . Coronary artery disease CARDIOLOGIST-  DR Sharyn Lull-- LOV  MAY 2013   (11-08-2011 DENIES CARDIAC SYMPTOMS)  . History of acute myocardial infarction 12-21-2006--  ACUTE INFEROPOSTERIOR WALL MI   S/P PTCA W/ DE STENTS TO RCA   . History of cocaine abuse (HCC)   . Hyperlipidemia   . Hypertension   . Right ureteral stone     There are no problems to display for this patient.   Past Surgical History:  Procedure Laterality Date  . CARDIOVASCULAR STRESS TEST  04-26-2007  DR HARWANI   NORMAL STUDY/ NO ISCHEMIA  . CORONARY ANGIOPLASTY WITH STENT PLACEMENT  12-18-2007  DR HARWANI   PTCA TO PROXIMAL RCA AND DRUG-ELUTING STENT IN PROXIMAL AND MID RCA  . INGUINAL HERNIA REPAIR     BILATERAL  . URETEROSCOPY  11/13/2011   Procedure: URETEROSCOPY;  Surgeon: Garnett Farm, MD;  Location: [facility];  Service: Urology;  Laterality: Right;  1 hour requested for this case  C-ARM CAMERA        No family history on file.  Social History   Tobacco Use    . Smoking status: Former Smoker    Packs/day: 1.00    Years: 15.00    Pack years: 15.00    Types: Cigarettes    Quit date: 12/18/2006    Years since quitting: 12.8  . Smokeless tobacco: Former Clinical biochemist  . Vaping Use: Never used  Substance Use Topics  . Alcohol use: Yes    Comment: HISTORY ALCOHOL ABUSE BEFORE 2008. ocassional now.  . Drug use: Not on file    Comment: HISTORY COCAINE USE PER ECHART DOCUMENTATION (LAST SMOKED  FEW YRS BEFORE 2008)    Home Medications Prior to Admission medications   Medication Sig Start Date End Date Taking? Authorizing Provider  aspirin EC 81 MG tablet Take 81 mg by mouth daily.    [provider]  atorvastatin (LIPITOR) 40 MG tablet Take 40 mg by mouth every morning.    [provider]  erythromycin ophthalmic ointment Place a 1/2 inch ribbon of ointment into the lower eyelid. 09/28/18   Maxwell Caul, PA-C  fish oil-omega-3 fatty acids 1000 MG capsule Take 2 g by mouth daily.    [provider]  HYDROcodone-acetaminophen (NORCO/VICODIN) 5-325 MG per tablet Take 2 tablets by mouth every 4 (  four) hours as needed for moderate pain or severe pain. 08/28/13   Oswaldo Conroy, PA-C  metoprolol succinate (TOPROL-XL) 25 MG 24 hr tablet Take 25 mg by mouth every morning.    [provider]  vitamin C (ASCORBIC ACID) 500 MG tablet Take 500 mg by mouth daily.    [provider]  zolpidem (AMBIEN) 10 MG tablet Take 5 mg by mouth at bedtime as needed for sleep.    [provider]    Allergies    Patient has no known allergies.  Review of Systems   Review of Systems  All other systems reviewed and are negative.   Physical Exam Updated Vital Signs BP 133/77 (BP Location: Left Arm)   Pulse 70   Temp 97.7 F (36.5 C) (Oral)   Resp 18   Ht 1.727 m (5\' 8" )   Wt 71.7 kg   SpO2 100%   BMI 24.02 kg/m   Physical Exam Vitals and nursing note reviewed.  Constitutional:      General: He  is not in acute distress.    Appearance: He is well-developed. He is diaphoretic.  HENT:     Head: Normocephalic.  Eyes:     Pupils: Pupils are equal, round, and reactive to light.  Cardiovascular:     Rate and Rhythm: Normal rate and regular rhythm.     Heart sounds: Normal heart sounds.  Pulmonary:     Breath sounds: Normal breath sounds.  Abdominal:     General: Bowel sounds are normal.     Palpations: Abdomen is soft.  Musculoskeletal:        General: Normal range of motion.     Cervical back: Normal range of motion.  Skin:    General: Skin is warm.     Capillary Refill: Capillary refill takes less than 2 seconds.  Neurological:     General: No focal deficit present.     Mental Status: He is alert and oriented to person, place, and time.  Psychiatric:        Mood and Affect: Mood normal.        Behavior: Behavior normal.     ED Results / Procedures / Treatments   Labs (all labs ordered are listed, but only abnormal results are displayed) Labs Reviewed  BASIC METABOLIC PANEL  CBC  TROPONIN I (HIGH SENSITIVITY)    EKG EKG Interpretation  Date/Time:  Saturday October 18 2019 15:10:32 EDT Ventricular Rate:  70 PR Interval:  170 QRS Duration: 98 QT Interval:  380 QTC Calculation: 410 R Axis:   60 Text Interpretation: Normal sinus rhythm st elevation II, III, avF,v4-v6 ** ** ACUTE MI / STEMI ** ** Confirmed by [id number] [phone]) on 10/18/2019 3:39:07 PM   Radiology No results found.  Procedures Procedures (including critical care time)  Medications Ordered in ED Medications  aspirin chewable tablet 324 mg (has no administration in time range)    ED Course  I have reviewed the triage vital signs and the nursing notes.  Pertinent labs & imaging results that were available during my care of the patient were reviewed by me and considered in my medical decision making (see chart for details).    MDM Rules/Calculators/A&P                           59 year old male who is followed by Dr. 58.  He has history of MI in 2008.  He has had very little  chest pain until today when he began having substernal chest pain while he was out working.  EKG shows ST elevation in inferior and anterior leads.  There is no reciprocity noted.  Patient paged out as code STEMI and discussed with Dr. Clifton James.  Dr. Sharyn Lull was initially called and indicated that Dr. Clifton James would be taking his STEMI call.  Discussed EKG and patient with Dr. Clifton James and continue with code STEMI.   Final Clinical Impression(s) / ED Diagnoses Final diagnoses:  None    Rx / DC Orders ED Discharge Orders    None       Margarita Grizzle, MD 10/18/19 2107

## 2019-10-18 NOTE — Progress Notes (Signed)
CRITICAL VALUE ALERT  Critical Value:  Troponin 17,557  Date & Time Notied:  10/18/19 2050  Provider Notified: Molli Hazard, MD  Orders Received/Actions taken: Provider notified no new orders at this time

## 2019-10-18 NOTE — Progress Notes (Signed)
   10/18/19 1542  Clinical Encounter Type  Visited With Patient  Visit Type Code  Referral From Nurse  Consult/Referral To Chaplain   No need for chaplain at present. No family present or expected. Chaplain remains available as needed.  This note was prepared by Chaplain Resident, Tacy Learn, MDiv. Chaplain remains available as needed through the on-call pager: [phone].

## 2019-10-18 NOTE — H&P (Addendum)
Cardiology Admission History and Physical:   Patient ID: Jeremy [name] MRN: [id number]; DOB: 1960-12-15   Admission date: 10/18/2019  Primary Care Provider: Kaleen Mask, MD Cardiologist: Sharyn Lull  Chief Complaint:  Chest pain  Patient Profile:   Jeremy [name] is a 60 y.o. male with history of CAD with inferior STEMI in 2008 (2 overlapping Cypher drug eluting stents placed in the proximal/mid RCA at that time), HTN, HLD and former tobacco abuse who presented to the ED today with c/o chest pain after working in his yard. He became overheated and had the onset of severe chest pain with worsened diaphoresis. No dyspnea. EKG with inferior and anterolateral ST elevation. Code STEMI called by the ED staff. Pt with ongoing but improved chest pain at the time of my examination in the ED.   History of Present Illness:   Jeremy [name] is a 59 y.o. male with history of CAD with inferior STEMI in 2008 (2 overlapping Cypher drug eluting stents placed in the proximal/mid RCA at that time), HTN, HLD and former tobacco abuse who presented to the ED today with c/o chest pain after working in his yard. He became overheated and had the onset of severe chest pain with worsened diaphoresis. No dyspnea. EKG with inferior and anterolateral ST elevation. Code STEMI called by the ED staff. Pt with ongoing but improved chest pain at the time of my examination in the ED.   Past Medical History:  Diagnosis Date  . Coronary artery disease CARDIOLOGIST-  DR Sharyn Lull-- LOV  MAY 2013   (11-08-2011 DENIES CARDIAC SYMPTOMS)  . History of acute myocardial infarction 12-21-2006--  ACUTE INFEROPOSTERIOR WALL MI   S/P PTCA W/ DE STENTS TO RCA   . History of cocaine abuse (HCC)   . Hyperlipidemia   . Hypertension   . Right ureteral stone     Past Surgical History:  Procedure Laterality Date  . CARDIOVASCULAR STRESS TEST  04-26-2007  DR HARWANI   NORMAL STUDY/ NO ISCHEMIA  . CORONARY ANGIOPLASTY WITH  STENT PLACEMENT  12-18-2007  DR HARWANI   PTCA TO PROXIMAL RCA AND DRUG-ELUTING STENT IN PROXIMAL AND MID RCA  . INGUINAL HERNIA REPAIR     BILATERAL  . URETEROSCOPY  11/13/2011   Procedure: URETEROSCOPY;  Surgeon: Garnett Farm, MD;  [facility]  Service: Urology;  Laterality: Right;  1 hour requested for this case  C-ARM CAMERA      Medications Prior to Admission: Metoprolol 12.5 mg po BID per pt Not taking any other medications  Allergies:   No Known Allergies  Social History:   Social History   Socioeconomic History  . Marital status: Single    Spouse name: Not on file  . Number of children: Not on file  . Years of education: Not on file  . Highest education level: Not on file  Occupational History  . Not on file  Tobacco Use  . Smoking status: Former Smoker    Packs/day: 1.00    Years: 15.00    Pack years: 15.00    Types: Cigarettes    Quit date: 12/18/2006    Years since quitting: 12.8  . Smokeless tobacco: Former Clinical biochemist  . Vaping Use: Never used  Substance and Sexual Activity  . Alcohol use: Yes    Comment: HISTORY ALCOHOL ABUSE BEFORE 2008. ocassional now.  . Drug use: Not on file    Comment: HISTORY COCAINE USE PER ECHART DOCUMENTATION (LAST SMOKED  FEW YRS BEFORE 2008)  . Sexual activity: Not on file  Other Topics Concern  . Not on file  Social History Narrative  . Not on file   Social Determinants of Health   Financial Resource Strain:   . Difficulty of Paying Living Expenses: Not on file  Food Insecurity:   . Worried About Programme researcher, broadcasting/film/video in the Last Year: Not on file  . Ran Out of Food in the Last Year: Not on file  Transportation Needs:   . Lack of Transportation (Medical): Not on file  . Lack of Transportation (Non-Medical): Not on file  Physical Activity:   . Days of Exercise per Week: Not on file  . Minutes of Exercise per Session: Not on file  Stress:   . Feeling of Stress : Not on file    Social Connections:   . Frequency of Communication with Friends and Family: Not on file  . Frequency of Social Gatherings with Friends and Family: Not on file  . Attends Religious Services: Not on file  . Active Member of Clubs or Organizations: Not on file  . Attends Banker Meetings: Not on file  . Marital Status: Not on file  Intimate Partner Violence:   . Fear of Current or Ex-Partner: Not on file  . Emotionally Abused: Not on file  . Physically Abused: Not on file  . Sexually Abused: Not on file    Family History:   The patient's family history is not on file.    ROS:  Please see the history of present illness.  All other ROS reviewed and negative.     Physical Exam/Data:   Vitals:   10/18/19 1520 10/18/19 1526  BP:  133/77  Pulse:  70  Resp:  18  Temp:  97.7 F (36.5 C)  TempSrc:  Oral  SpO2:  100%  Weight: 71.7 kg   Height: 5\' 8"  (1.727 m)    No intake or output data in the 24 hours ending 10/18/19 1612 Last 3 Weights 10/18/2019 09/28/2018 09/28/2018  Weight (lbs) 158 lb 171 lb 12.8 oz 171 lb 12.8 oz  Weight (kg) 71.668 kg 77.928 kg 77.928 kg     Body mass index is 24.02 kg/m.  General:  Well nourished, well developed, appears uncomfortable HEENT: normal Lymph: no adenopathy Neck: no JVD Endocrine:  No thryomegaly Vascular: No carotid bruits; FA pulses 2+ bilaterally without bruits  Cardiac:  normal S1, S2; RRR; no murmur  Lungs:  clear to auscultation bilaterally, no wheezing, rhonchi or rales  Abd: soft, nontender, no hepatomegaly  Ext: no LE edema Musculoskeletal:  No deformities, BUE and BLE strength normal and equal Skin: warm and dry  Neuro:  CNs 2-12 intact, no focal abnormalities noted Psych:  Normal affect    EKG:  The ECG that was done was personally reviewed and demonstrates ST elevation in the inferior and anterolateral leads  Relevant CV Studies: none  Laboratory Data:  High Sensitivity Troponin:  No results for input(s):  TROPONINIHS in the last 720 hours.    ChemistryNo results for input(s): NA, K, CL, CO2, GLUCOSE, BUN, CREATININE, CALCIUM, GFRNONAA, GFRAA, ANIONGAP in the last 168 hours.  No results for input(s): PROT, ALBUMIN, AST, ALT, ALKPHOS, BILITOT in the last 168 hours. Hematology Recent Labs  Lab 10/18/19 1541  WBC 9.7  RBC 5.16  HGB 15.5  HCT 45.3  MCV 87.8  MCH 30.0  MCHC 34.2  RDW 11.8  PLT 280   BNPNo results  for input(s): BNP, PROBNP in the last 168 hours.  DDimer No results for input(s): DDIMER in the last 168 hours.   Radiology/Studies:  No results found. {  Assessment and Plan:   1. Acute STEMI: Pt with ongoing chest pain. History of prior stenting of the RCA with first generation drug eluting stents. Moderate proximal Circumflex stenosis at that time. Will plan emergent cardiac catheterization.   Severity of Illness: The appropriate patient status for this patient is INPATIENT. Inpatient status is judged to be reasonable and necessary in order to provide the required intensity of service to ensure the patient's safety. The patient's presenting symptoms, physical exam findings, and initial radiographic and laboratory data in the context of their chronic comorbidities is felt to place them at high risk for further clinical deterioration. Furthermore, it is not anticipated that the patient will be medically stable for discharge from the hospital within 2 midnights of admission. The following factors support the patient status of inpatient.   " The patient's presenting symptoms include chest pain. " The worrisome physical exam findings include  " The initial radiographic and laboratory data are worrisome because of ST elevation EKG " The chronic co-morbidities include CAD, HTN, HLD   * I certify that at the point of admission it is my clinical judgment that the patient will require inpatient hospital care spanning beyond 2 midnights from the point of admission due to high  intensity of service, high risk for further deterioration and high frequency of surveillance required.*    For questions or updates, please contact [facility] Please consult [email or web address] for contact info under     Signed, [name], MD  10/18/2019 4:12 PM

## 2019-10-18 NOTE — ED Notes (Signed)
Pt c/o sudden increase in chest pain, restless and moving around on the bed.  Dr. Rosalia Hammers made aware, orders obtained.

## 2019-10-18 NOTE — ED Triage Notes (Signed)
Pt to ED with c/o mid to left chest pain, onset approx 1 hour ago while doing yard work.  Pt st's pain feels like it did when he had an MI in 2008.  Pt denies SOB, nausea or diaphoresis

## 2019-10-19 ENCOUNTER — Inpatient Hospital Stay (HOSPITAL_COMMUNITY): Payer: PRIVATE HEALTH INSURANCE

## 2019-10-19 ENCOUNTER — Other Ambulatory Visit: Payer: Self-pay

## 2019-10-19 DIAGNOSIS — I77810 Thoracic aortic ectasia: Secondary | ICD-10-CM

## 2019-10-19 DIAGNOSIS — I2102 ST elevation (STEMI) myocardial infarction involving left anterior descending coronary artery: Secondary | ICD-10-CM

## 2019-10-19 HISTORY — DX: Thoracic aortic ectasia: I77.810

## 2019-10-19 LAB — CBC
HCT: 40 % (ref 39.0–52.0)
Hemoglobin: 13.8 g/dL (ref 13.0–17.0)
MCH: 29.7 pg (ref 26.0–34.0)
MCHC: 34.5 g/dL (ref 30.0–36.0)
MCV: 86 fL (ref 80.0–100.0)
Platelets: 243 10*3/uL (ref 150–400)
RBC: 4.65 MIL/uL (ref 4.22–5.81)
RDW: 11.9 % (ref 11.5–15.5)
WBC: 9.1 10*3/uL (ref 4.0–10.5)
nRBC: 0 % (ref 0.0–0.2)

## 2019-10-19 LAB — BASIC METABOLIC PANEL
Anion gap: 11 (ref 5–15)
BUN: 15 mg/dL (ref 6–20)
CO2: 20 mmol/L — ABNORMAL LOW (ref 22–32)
Calcium: 9.1 mg/dL (ref 8.9–10.3)
Chloride: 108 mmol/L (ref 98–111)
Creatinine, Ser: 0.81 mg/dL (ref 0.61–1.24)
GFR calc Af Amer: >60 mL/min (ref >60)
GFR calc non Af Amer: >60 mL/min (ref >60)
Glucose, Bld: 92 mg/dL (ref 70–99)
Potassium: 3.6 mmol/L (ref 3.5–5.1)
Sodium: 139 mmol/L (ref 135–145)

## 2019-10-19 LAB — ECHOCARDIOGRAM COMPLETE
AR max vel: 2.75 cm2
AV Area VTI: 3.04 cm2
AV Area mean vel: 2.39 cm2
AV Mean grad: 4.8 mmHg
AV Peak grad: 8.9 mmHg
Ao pk vel: 1.49 m/s
Area-P 1/2: 2.8 cm2
Height: 68 in
S' Lateral: 3 cm
Weight: 2617.3 oz

## 2019-10-19 MED ORDER — ZOLPIDEM TARTRATE 5 MG PO TABS: 5.0000 mg | ORAL_TABLET | Freq: Every evening | ORAL | Status: DC | PRN

## 2019-10-19 MED ORDER — CHLORHEXIDINE GLUCONATE CLOTH 2 % EX PADS
6.0000 | MEDICATED_PAD | Freq: Every day | CUTANEOUS | Status: DC
  Administered 2019-10-19 – 2019-10-20 (×2): 6 via TOPICAL

## 2019-10-19 MED ORDER — POTASSIUM CHLORIDE CRYS ER 20 MEQ PO TBCR
40.0000 meq | EXTENDED_RELEASE_TABLET | Freq: Once | ORAL | Status: AC
  Administered 2019-10-19: 40 meq via ORAL
  Filled 2019-10-19: qty 2

## 2019-10-19 NOTE — Progress Notes (Signed)
Progress Note  Patient Name: Jeremy [name] Date of Encounter: 10/19/2019  Primary Cardiologist: No primary care provider on file.   Subjective   NAEO. S/p emergent cath yesterday with prox LAD stents placed x 2. Severe RCA lesion distally which will be staged this coming week.  Inpatient Medications    Scheduled Meds: . aspirin EC  81 mg Oral Daily  . atorvastatin  80 mg Oral Daily  . Chlorhexidine Gluconate Cloth  6 each Topical Daily  . metoprolol tartrate  25 mg Oral BID  . sodium chloride flush  3 mL Intravenous Q12H  . ticagrelor  90 mg Oral BID   Continuous Infusions: . sodium chloride     PRN Meds: sodium chloride, acetaminophen, ondansetron (ZOFRAN) IV, oxyCODONE, sodium chloride flush   Vital Signs    Vitals:   10/19/19 0800 10/19/19 0900 10/19/19 0945 10/19/19 1000  BP: 113/67 (!) 119/98  109/74  Pulse: 73 80 (!) 58 60  Resp: (!) 21 (!) 25  (!) 21  Temp:      TempSrc:      SpO2: 97% 100%  99%  Weight:      Height:        Intake/Output Summary (Last 24 hours) at 10/19/2019 1042 Last data filed at 10/19/2019 1000 Gross per 24 hour  Intake 1023.38 ml  Output 1625 ml  Net -601.62 ml   Filed Weights   10/18/19 1520 10/19/19 0640  Weight: 71.7 kg 74.2 kg    Telemetry    Sinus rhythm - Personally Reviewed  ECG    No new - Personally Reviewed  Physical Exam   GEN: No acute distress.   Neck: No JVD Cardiac: RRR, no murmurs, rubs, or gallops. R radial access site w/o hematoma or pain. Respiratory: Clear to auscultation bilaterally. GI: Soft, nontender, non-distended  MS: No edema; No deformity. Neuro:  Nonfocal  Psych: Normal affect   Labs    Chemistry Recent Labs  Lab 10/18/19 1541 10/18/19 1555 10/18/19 1607 10/19/19 0433  NA 140  --  142 139  K 4.2  --  3.9 3.6  CL 108  --  107 108  CO2 21*  --   --  20*  GLUCOSE 110*  --  106* 92  BUN 25*  --  28* 15  CREATININE 1.13  --  0.90 0.81  CALCIUM 9.8  --   --  9.1  PROT  --   7.2  --   --   ALBUMIN  --  4.5  --   --   AST  --  29  --   --   ALT  --  38  --   --   ALKPHOS  --  62  --   --   BILITOT  --  0.9  --   --   GFRNONAA >60  --   --  >60  GFRAA >60  --   --  >60  ANIONGAP 11  --   --  11     Hematology Recent Labs  Lab 10/18/19 1541 10/18/19 1607 10/19/19 0433  WBC 9.7  --  9.1  RBC 5.16  --  4.65  HGB 15.5 14.6 13.8  HCT 45.3 43.0 40.0  MCV 87.8  --  86.0  MCH 30.0  --  29.7  MCHC 34.2  --  34.5  RDW 11.8  --  11.9  PLT 280  --  243    Cardiac EnzymesNo results for input(s): TROPONINI  in the last 168 hours. No results for input(s): TROPIPOC in the last 168 hours.   BNPNo results for input(s): BNP, PROBNP in the last 168 hours.   DDimer No results for input(s): DDIMER in the last 168 hours.   Radiology    CARDIAC CATHETERIZATION  Result Date: 10/18/2019  Prox RCA lesion is 65% stenosed.  Mid RCA lesion is 50% stenosed.  Dist RCA lesion is 99% stenosed.  1st Mrg lesion is 80% stenosed.  Mid LAD-1 lesion is 70% stenosed.  Mid LAD-2 lesion is 100% stenosed.  A drug-eluting stent was successfully placed using a SYNERGY XD 2.75X24.  Post intervention, there is a 0% residual stenosis.  A drug-eluting stent was successfully placed using a SYNERGY XD 2.75X12.  Post intervention, there is a 0% residual stenosis.  The left ventricular systolic function is normal.  LV end diastolic pressure is normal.  1. Acute anterior STEMI secondary to thrombotic occlusion of the mid LAD 2. Successful PTCA/DES x 2 mid LAD 3. Severe stenosis first obtuse marginal branch 4. The RCA is a large dominant vessel with a patent proximal stented segment. There is moderate restenosis in the stents. Severe distal RCA stenosis. 5. Segmental LV systolic dysfunction. Hand injection of contrast and unable to fully comment on LVEF. Recommendations: Will admit to the ICU. Will continue Aggrastat drip x 2 hours. Will continue DAPT with ASA and Brilinta for at least 12  months. Continue beta blocker. Will start high intensity statin. Echo in the am. Will need staged PCI of the RCA and the Circumflex/OM lesions next week. He is followed by Dr. Sharyn Lull. Dr. Sharyn Lull is aware of his admission.   DG Chest Portable 1 View  Result Date: 10/18/2019 CLINICAL DATA:  STEMI. EXAM: PORTABLE CHEST 1 VIEW COMPARISON:  02/11/2008 FINDINGS: Lungs are well inflated and otherwise clear. Cardiomediastinal silhouette and remainder of the exam is unchanged. IMPRESSION: No active disease. Electronically Signed   By: Elberta Fortis M.D.   On: 10/18/2019 16:20    Cardiac Studies   Echo pending  10/18/2019 cath personally reviewed Thrombotic LAD occlusion now s/p 2 stents Severe distal RCA lesion (99%) Moderate restenosis of the RCA stents  Patient Profile     59 y.o. male with hx of CAD s/p prior RCA stents who presents with acute STEMI due to prox LAD occlusion. Now s/p DES x 2 to LAD.  Assessment & Plan    1. STEMI Hemodynamically stable post PCI to the LAD. Plan to stage intervention to the right coronary this week. - cont aspirin, atorvastatin, ticagrelor, metoprolol  For questions or updates, please contact [facility] Please consult [email or web address] for contact info under Cardiology/STEMI.      Signed, Steffanie Dunn, MD  10/19/2019, 10:42 AM

## 2019-10-19 NOTE — Progress Notes (Signed)
TR band deflated to 0 @ 2245. Band removed at 0000, gauze placed with transparent dressing over top. No hematoma;ecchymosis noted. No oozing. Will continue to monitor.

## 2019-10-19 NOTE — Progress Notes (Signed)
°  Echocardiogram 2D Echocardiogram has been performed.  Gerda Diss 10/19/2019, 1:57 PM

## 2019-10-20 ENCOUNTER — Encounter (HOSPITAL_COMMUNITY): Payer: Self-pay | Admitting: Cardiovascular Disease

## 2019-10-20 ENCOUNTER — Inpatient Hospital Stay (HOSPITAL_COMMUNITY)
Admission: EM | Disposition: A | Discharge status: Home / Self Care | Payer: Self-pay | Source: Home / Self Care | Attending: Cardiovascular Disease

## 2019-10-20 ENCOUNTER — Inpatient Hospital Stay (HOSPITAL_COMMUNITY): Payer: PRIVATE HEALTH INSURANCE

## 2019-10-20 DIAGNOSIS — I25110 Atherosclerotic heart disease of native coronary artery with unstable angina pectoris: Secondary | ICD-10-CM

## 2019-10-20 HISTORY — PX: CORONARY STENT INTERVENTION: CATH118234

## 2019-10-20 LAB — BASIC METABOLIC PANEL
Anion gap: 9 (ref 5–15)
BUN: 13 mg/dL (ref 6–20)
CO2: 24 mmol/L (ref 22–32)
Calcium: 9.3 mg/dL (ref 8.9–10.3)
Chloride: 105 mmol/L (ref 98–111)
Creatinine, Ser: 0.86 mg/dL (ref 0.61–1.24)
GFR calc Af Amer: >60 mL/min (ref >60)
GFR calc non Af Amer: >60 mL/min (ref >60)
Glucose, Bld: 102 mg/dL — ABNORMAL HIGH (ref 70–99)
Potassium: 4.6 mmol/L (ref 3.5–5.1)
Sodium: 138 mmol/L (ref 135–145)

## 2019-10-20 LAB — CBC
HCT: 43.2 % (ref 39.0–52.0)
Hemoglobin: 14.5 g/dL (ref 13.0–17.0)
MCH: 29.7 pg (ref 26.0–34.0)
MCHC: 33.6 g/dL (ref 30.0–36.0)
MCV: 88.3 fL (ref 80.0–100.0)
Platelets: 278 10*3/uL (ref 150–400)
RBC: 4.89 MIL/uL (ref 4.22–5.81)
RDW: 12.2 % (ref 11.5–15.5)
WBC: 7.7 10*3/uL (ref 4.0–10.5)
nRBC: 0 % (ref 0.0–0.2)

## 2019-10-20 LAB — POCT ACTIVATED CLOTTING TIME
Activated Clotting Time: 224 seconds
Activated Clotting Time: 566 seconds
Activated Clotting Time: 323 seconds
Activated Clotting Time: 252 seconds

## 2019-10-20 SURGERY — CORONARY STENT INTERVENTION
Anesthesia: LOCAL

## 2019-10-20 MED ORDER — SODIUM CHLORIDE 0.9 % IV SOLN: INTRAVENOUS | Status: DC

## 2019-10-20 MED ORDER — HEPARIN (PORCINE) IN NACL 1000-0.9 UT/500ML-% IV SOLN
INTRAVENOUS | Status: DC | PRN
  Administered 2019-10-20: 500 mL

## 2019-10-20 MED ORDER — FENTANYL CITRATE (PF) 100 MCG/2ML IJ SOLN
INTRAMUSCULAR | Status: DC | PRN
Start: 2019-10-20 — End: 2019-10-20
  Administered 2019-10-20 (×2): 25 ug via INTRAVENOUS
  Administered 2019-10-20: 50 ug via INTRAVENOUS

## 2019-10-20 MED ORDER — MIDAZOLAM HCL 2 MG/2ML IJ SOLN
INTRAMUSCULAR | Status: DC | PRN
  Administered 2019-10-20: 2 mg via INTRAVENOUS

## 2019-10-20 MED ORDER — HEPARIN SODIUM (PORCINE) 1000 UNIT/ML IJ SOLN
INTRAMUSCULAR | Status: AC
  Filled 2019-10-20: qty 1

## 2019-10-20 MED ORDER — LIDOCAINE HCL (PF) 1 % IJ SOLN
INTRAMUSCULAR | Status: AC
  Filled 2019-10-20: qty 30

## 2019-10-20 MED ORDER — LABETALOL HCL 5 MG/ML IV SOLN: 10.0000 mg | INTRAVENOUS | Status: AC | PRN

## 2019-10-20 MED ORDER — MIDAZOLAM HCL 2 MG/2ML IJ SOLN
INTRAMUSCULAR | Status: AC
  Filled 2019-10-20: qty 2

## 2019-10-20 MED ORDER — IOHEXOL 350 MG/ML SOLN
INTRAVENOUS | Status: DC | PRN
  Administered 2019-10-20: 135 mL

## 2019-10-20 MED ORDER — ASPIRIN 81 MG PO CHEW: 81.0000 mg | CHEWABLE_TABLET | ORAL | Status: DC

## 2019-10-20 MED ORDER — SODIUM CHLORIDE 0.9% FLUSH
3.0000 mL | Freq: Two times a day (BID) | INTRAVENOUS | Status: DC
  Administered 2019-10-21: 3 mL via INTRAVENOUS

## 2019-10-20 MED ORDER — HEPARIN (PORCINE) IN NACL 1000-0.9 UT/500ML-% IV SOLN
INTRAVENOUS | Status: AC
  Filled 2019-10-20: qty 1000

## 2019-10-20 MED ORDER — SODIUM CHLORIDE 0.9 % IV SOLN: INTRAVENOUS | Status: AC

## 2019-10-20 MED ORDER — SODIUM CHLORIDE 0.9 % IV SOLN: 250.0000 mL | INTRAVENOUS | Status: DC | PRN

## 2019-10-20 MED ORDER — NITROGLYCERIN 1 MG/10 ML FOR IR/CATH LAB
INTRA_ARTERIAL | Status: AC
  Filled 2019-10-20: qty 10

## 2019-10-20 MED ORDER — HYDRALAZINE HCL 20 MG/ML IJ SOLN: 10.0000 mg | INTRAMUSCULAR | Status: AC | PRN

## 2019-10-20 MED ORDER — IOHEXOL 350 MG/ML SOLN
INTRAVENOUS | Status: AC
  Filled 2019-10-20: qty 1

## 2019-10-20 MED ORDER — SODIUM CHLORIDE 0.9% FLUSH: 3.0000 mL | INTRAVENOUS | Status: DC | PRN

## 2019-10-20 MED ORDER — FENTANYL CITRATE (PF) 100 MCG/2ML IJ SOLN
INTRAMUSCULAR | Status: AC
  Filled 2019-10-20: qty 2

## 2019-10-20 MED ORDER — LIDOCAINE HCL (PF) 1 % IJ SOLN
INTRAMUSCULAR | Status: DC | PRN
  Administered 2019-10-20: 2 mL

## 2019-10-20 MED ORDER — SODIUM CHLORIDE 0.9% FLUSH
3.0000 mL | Freq: Two times a day (BID) | INTRAVENOUS | Status: DC
  Administered 2019-10-20: 3 mL via INTRAVENOUS
  Administered 2019-10-20: 5 mL via INTRAVENOUS

## 2019-10-20 MED ORDER — HEPARIN SODIUM (PORCINE) 1000 UNIT/ML IJ SOLN
INTRAMUSCULAR | Status: DC | PRN
  Administered 2019-10-20: 10000 [IU] via INTRAVENOUS
  Administered 2019-10-20: 2000 [IU] via INTRAVENOUS

## 2019-10-20 MED ORDER — VERAPAMIL HCL 2.5 MG/ML IV SOLN
INTRAVENOUS | Status: DC | PRN
  Administered 2019-10-20: 10 mL via INTRA_ARTERIAL

## 2019-10-20 MED ORDER — VERAPAMIL HCL 2.5 MG/ML IV SOLN
INTRAVENOUS | Status: AC
  Filled 2019-10-20: qty 2

## 2019-10-20 SURGICAL SUPPLY — 23 items
BALLN SAPPHIRE 2.5X12 (BALLOONS) ×2
BALLN SAPPHIRE ~~LOC~~ 3.0X12 (BALLOONS) ×1 IMPLANT
BALLN SAPPHIRE ~~LOC~~ 3.75X12 (BALLOONS) ×1 IMPLANT
BALLN WOLVERINE 3.00X15 (BALLOONS) ×2
BALLOON SAPPHIRE 2.5X12 (BALLOONS) IMPLANT
BALLOON WOLVERINE 3.00X15 (BALLOONS) IMPLANT
CATH LAUNCHER 6FR AL.75 (CATHETERS) ×1 IMPLANT
CATH VISTA GUIDE 6FR XBLAD3.5 (CATHETERS) ×1 IMPLANT
DEVICE RAD COMP TR BAND LRG (VASCULAR PRODUCTS) ×1 IMPLANT
ELECT DEFIB PAD ADLT CADENCE (PAD) ×1 IMPLANT
GLIDESHEATH SLEND SS 6F .021 (SHEATH) ×1 IMPLANT
GUIDEWIRE INQWIRE 1.5J.035X260 (WIRE) IMPLANT
INQWIRE 1.5J .035X260CM (WIRE) ×2
KIT ENCORE 26 ADVANTAGE (KITS) ×1 IMPLANT
KIT HEART LEFT (KITS) ×2 IMPLANT
PACK CARDIAC CATHETERIZATION (CUSTOM PROCEDURE TRAY) ×2 IMPLANT
STENT SYNERGY XD 2.75X16 (Permanent Stent) IMPLANT
STENT SYNERGY XD 3.50X16 (Permanent Stent) IMPLANT
SYNERGY XD 2.75X16 (Permanent Stent) ×2 IMPLANT
SYNERGY XD 3.50X16 (Permanent Stent) ×2 IMPLANT
TRANSDUCER W/STOPCOCK (MISCELLANEOUS) ×2 IMPLANT
TUBING CIL FLEX 10 FLL-RA (TUBING) ×2 IMPLANT
WIRE COUGAR XT STRL 190CM (WIRE) ×1 IMPLANT

## 2019-10-20 NOTE — Progress Notes (Signed)
Patient transferred to cath lab

## 2019-10-20 NOTE — Progress Notes (Signed)
CARDIAC REHAB PHASE I   PRE:  Rate/Rhythm: 56 SB  BP:  Sitting: 107/78      MODE:  Ambulation: 370 ft   POST:  Rate/Rhythm: 64 SR  BP:  Sitting: 121/79   Pt ambulated 340ft in hallway independently with steady gait. Pt denies CP, SOB, or dizziness throughout the walk. MI ed began with pt. Pt given MI book. Educated on importance of ASA and Brilinta. Reviewed restrictions. Pt for staged intervention today. Will f/u tomorrow to complete education.  [id number] Reynold Bowen, RN BSN 10/20/2019 11:05 AM

## 2019-10-20 NOTE — H&P (View-Only) (Signed)
Progress Note  Patient Name: Jeremy [name] Date of Encounter: 10/20/2019  [facility] Cardiologist: No primary care provider on file.   Subjective   No CP or dyspnea. Complains of right shoulder ache which has been constant since the cath/PCI procedure.  Inpatient Medications    Scheduled Meds: . aspirin EC  81 mg Oral Daily  . atorvastatin  80 mg Oral Daily  . Chlorhexidine Gluconate Cloth  6 each Topical Daily  . metoprolol tartrate  25 mg Oral BID  . sodium chloride flush  3 mL Intravenous Q12H  . ticagrelor  90 mg Oral BID   Continuous Infusions: . sodium chloride     PRN Meds: sodium chloride, acetaminophen, ondansetron (ZOFRAN) IV, oxyCODONE, sodium chloride flush, zolpidem   Vital Signs    Vitals:   10/20/19 0600 10/20/19 0700 10/20/19 0800 10/20/19 0833  BP: 124/81     Pulse: 65 74    Resp: 20 (!) 27 17   Temp:    98.7 F (37.1 C)  TempSrc:    Oral  SpO2: 98% 98%    Weight:      Height:        Intake/Output Summary (Last 24 hours) at 10/20/2019 0838 Last data filed at 10/20/2019 0800 Gross per 24 hour  Intake 720 ml  Output 1800 ml  Net -1080 ml   Last 3 Weights 10/19/2019 10/18/2019 09/28/2018  Weight (lbs) 163 lb 9.3 oz 158 lb 171 lb 12.8 oz  Weight (kg) 74.2 kg 71.668 kg 77.928 kg      Telemetry    Sinus rhythm - Personally Reviewed   Physical Exam  Alert, oriented, NAD GEN: No acute distress.   Neck: No JVD Cardiac: RRR, no murmurs, rubs, or gallops.  Respiratory: Clear to auscultation bilaterally. GI: Soft, nontender, non-distended  MS: No edema; No deformity. Mild shoulder tenderness to palpation. Neuro:  Nonfocal  Psych: Normal affect   Labs    High Sensitivity Troponin:   Recent Labs  Lab 10/18/19 1541 10/18/19 1906  TROPONINIHS 22* 17,557*      Chemistry Recent Labs  Lab 10/18/19 1541 10/18/19 1541 10/18/19 1555 10/18/19 1607 10/19/19 0433 10/20/19 0156  NA 140   < >  --  142 139 138  K 4.2   < >  --  3.9  3.6 4.6  CL 108   < >  --  107 108 105  CO2 21*  --   --   --  20* 24  GLUCOSE 110*   < >  --  106* 92 102*  BUN 25*   < >  --  28* 15 13  CREATININE 1.13   < >  --  0.90 0.81 0.86  CALCIUM 9.8  --   --   --  9.1 9.3  PROT  --   --  7.2  --   --   --   ALBUMIN  --   --  4.5  --   --   --   AST  --   --  29  --   --   --   ALT  --   --  38  --   --   --   ALKPHOS  --   --  62  --   --   --   BILITOT  --   --  0.9  --   --   --   GFRNONAA >60  --   --   --  >  60 >60  GFRAA >60  --   --   --  >60 >60  ANIONGAP 11  --   --   --  11 9   < > = values in this interval not displayed.     Hematology Recent Labs  Lab 10/18/19 1541 10/18/19 1541 10/18/19 1607 10/19/19 0433 10/20/19 0156  WBC 9.7  --   --  9.1 7.7  RBC 5.16  --   --  4.65 4.89  HGB 15.5   < > 14.6 13.8 14.5  HCT 45.3   < > 43.0 40.0 43.2  MCV 87.8  --   --  86.0 88.3  MCH 30.0  --   --  29.7 29.7  MCHC 34.2  --   --  34.5 33.6  RDW 11.8  --   --  11.9 12.2  PLT 280  --   --  243 278   < > = values in this interval not displayed.    BNPNo results for input(s): BNP, PROBNP in the last 168 hours.   DDimer No results for input(s): DDIMER in the last 168 hours.   Radiology    CARDIAC CATHETERIZATION  Result Date: 10/18/2019  Prox RCA lesion is 65% stenosed.  Mid RCA lesion is 50% stenosed.  Dist RCA lesion is 99% stenosed.  1st Mrg lesion is 80% stenosed.  Mid LAD-1 lesion is 70% stenosed.  Mid LAD-2 lesion is 100% stenosed.  A drug-eluting stent was successfully placed using a SYNERGY XD 2.75X24.  Post intervention, there is a 0% residual stenosis.  A drug-eluting stent was successfully placed using a SYNERGY XD 2.75X12.  Post intervention, there is a 0% residual stenosis.  The left ventricular systolic function is normal.  LV end diastolic pressure is normal.  1. Acute anterior STEMI secondary to thrombotic occlusion of the mid LAD 2. Successful PTCA/DES x 2 mid LAD 3. Severe stenosis first obtuse marginal  branch 4. The RCA is a large dominant vessel with a patent proximal stented segment. There is moderate restenosis in the stents. Severe distal RCA stenosis. 5. Segmental LV systolic dysfunction. Hand injection of contrast and unable to fully comment on LVEF. Recommendations: Will admit to the ICU. Will continue Aggrastat drip x 2 hours. Will continue DAPT with ASA and Brilinta for at least 12 months. Continue beta blocker. Will start high intensity statin. Echo in the am. Will need staged PCI of the RCA and the Circumflex/OM lesions next week. He is followed by Dr. Sharyn Lull. Dr. Sharyn Lull is aware of his admission.   DG Chest Portable 1 View  Result Date: 10/18/2019 CLINICAL DATA:  STEMI. EXAM: PORTABLE CHEST 1 VIEW COMPARISON:  02/11/2008 FINDINGS: Lungs are well inflated and otherwise clear. Cardiomediastinal silhouette and remainder of the exam is unchanged. IMPRESSION: No active disease. Electronically Signed   By: Elberta Fortis M.D.   On: 10/18/2019 16:20   ECHOCARDIOGRAM COMPLETE  Result Date: 10/19/2019    ECHOCARDIOGRAM REPORT   Patient Name:   Jeremy [name] Date of Exam: 10/19/2019 Medical Rec #:  [id number]        Height:       68.0 in Accession #:    [id number]       Weight:       163.6 lb Date of Birth:  April 19, 1960        BSA:          1.876 m Patient Age:    79 years  BP:           109/74 mmHg Patient Gender: M                HR:           60 bpm. Exam Location:  Inpatient Procedure: 2D Echo, Cardiac Doppler and Color Doppler Indications:    STEMI  History:        Patient has no prior history of Echocardiogram examinations.                 CAD; Risk Factors:Former Smoker.  Sonographer:    Ross Ludwig RDCS (AE) Referring Phys: [id number] CHRISTOPHER D [facility]  Sonographer Comments: Suboptimal parasternal window and suboptimal subcostal window. Patient having right shoulder pain and moving frequently during echo. IMPRESSIONS  1. The apex is akinetic. Marland Kitchen Left ventricular ejection fraction, by  estimation, is 40%. The left ventricle has mildly decreased function. The left ventricle demonstrates regional wall motion abnormalities (see scoring diagram/findings for description). There is moderate left ventricular hypertrophy. Left ventricular diastolic parameters were normal.  2. Right ventricular systolic function was not well visualized. The right ventricular size is not well visualized.  3. The mitral valve was not well visualized. No evidence of mitral valve regurgitation. No evidence of mitral stenosis.  4. The aortic valve was not well visualized. Aortic valve regurgitation is not visualized. No aortic stenosis is present.  5. Aortic dilatation noted. There is mild dilatation of the aortic root, measuring 41 mm. FINDINGS  Left Ventricle: The apex is akinetic. Left ventricular ejection fraction, by estimation, is 40%. The left ventricle has mildly decreased function. The left ventricle demonstrates regional wall motion abnormalities. The left ventricular internal cavity size was normal in size. There is moderate left ventricular hypertrophy. Left ventricular diastolic parameters were normal. Right Ventricle: The right ventricular size is not well visualized. Right vetricular wall thickness was not well visualized. Right ventricular systolic function was not well visualized. Left Atrium: Left atrial size was normal in size. Right Atrium: Right atrial size was normal in size. Pericardium: There is no evidence of pericardial effusion. Mitral Valve: The mitral valve was not well visualized. No evidence of mitral valve regurgitation. No evidence of mitral valve stenosis. MV peak gradient, 3.3 mmHg. The mean mitral valve gradient is 1.0 mmHg. Tricuspid Valve: The tricuspid valve is normal in structure. Tricuspid valve regurgitation is not demonstrated. No evidence of tricuspid stenosis. Aortic Valve: The aortic valve was not well visualized. Aortic valve regurgitation is not visualized. No aortic stenosis is  present. Aortic valve mean gradient measures 4.8 mmHg. Aortic valve peak gradient measures 8.9 mmHg. Aortic valve area, by VTI measures 3.04 cm. Pulmonic Valve: The pulmonic valve was not well visualized. Pulmonic valve regurgitation is not visualized. No evidence of pulmonic stenosis. Aorta: Aortic dilatation noted. There is mild dilatation of the aortic root, measuring 41 mm. Venous: The inferior vena cava was not well visualized. IAS/Shunts: No atrial level shunt detected by color flow Doppler.  LEFT VENTRICLE PLAX 2D LVIDd:         4.10 cm  Diastology LVIDs:         3.00 cm  LV e' medial:    7.62 cm/s LV PW:         1.40 cm  LV E/e' medial:  10.9 LV IVS:        1.40 cm  LV e' lateral:   10.90 cm/s LVOT diam:     2.30 cm  LV E/e' lateral: 7.6  LV SV:         79 LV SV Index:   42 LVOT Area:     4.15 cm  RIGHT VENTRICLE RV Basal diam:  3.30 cm RV S prime:     12.80 cm/s TAPSE (M-mode): 2.4 cm LEFT ATRIUM             Index       RIGHT ATRIUM           Index LA diam:        2.80 cm 1.49 cm/m  RA Area:     14.50 cm LA Vol (A2C):   59.6 ml 31.76 ml/m RA Volume:   36.30 ml  19.35 ml/m LA Vol (A4C):   51.2 ml 27.29 ml/m LA Biplane Vol: 55.5 ml 29.58 ml/m  AORTIC VALVE AV Area (Vmax):    2.75 cm AV Area (Vmean):   2.39 cm AV Area (VTI):     3.04 cm AV Vmax:           149.31 cm/s AV Vmean:          102.331 cm/s AV VTI:            0.259 m AV Peak Grad:      8.9 mmHg AV Mean Grad:      4.8 mmHg LVOT Vmax:         98.93 cm/s LVOT Vmean:        58.862 cm/s LVOT VTI:          0.190 m LVOT/AV VTI ratio: 0.73  AORTA Ao Root diam: 4.10 cm MITRAL VALVE MV Area (PHT): 2.80 cm    SHUNTS MV Peak grad:  3.3 mmHg    Systemic VTI:  0.19 m MV Mean grad:  1.0 mmHg    Systemic Diam: 2.30 cm MV Vmax:       0.90 m/s MV Vmean:      53.8 cm/s MV Decel Time: 271 msec MV E velocity: 83.10 cm/s MV A velocity: 83.60 cm/s MV E/A ratio:  0.99 Dina Rich MD Electronically signed by Dina Rich MD Signature Date/Time:  10/19/2019/2:57:17 PM    Final     Cardiac Studies   2D echocardiogram: IMPRESSIONS    1. The apex is akinetic. Marland Kitchen Left ventricular ejection fraction, by  estimation, is 40%. The left ventricle has mildly decreased function. The  left ventricle demonstrates regional wall motion abnormalities (see  scoring diagram/findings for description).  There is moderate left ventricular hypertrophy. Left ventricular diastolic  parameters were normal.  2. Right ventricular systolic function was not well visualized. The right  ventricular size is not well visualized.  3. The mitral valve was not well visualized. No evidence of mitral valve  regurgitation. No evidence of mitral stenosis.  4. The aortic valve was not well visualized. Aortic valve regurgitation  is not visualized. No aortic stenosis is present.  5. Aortic dilatation noted. There is mild dilatation of the aortic root,  measuring 41 mm.  Cardiac catheterization 10/18/2019: Conclusion    Prox RCA lesion is 65% stenosed.  Mid RCA lesion is 50% stenosed.  Dist RCA lesion is 99% stenosed.  1st Mrg lesion is 80% stenosed.  Mid LAD-1 lesion is 70% stenosed.  Mid LAD-2 lesion is 100% stenosed.  A drug-eluting stent was successfully placed using a SYNERGY XD 2.75X24.  Post intervention, there is a 0% residual stenosis.  A drug-eluting stent was successfully placed using a SYNERGY XD 2.75X12.  Post intervention, there is a 0%  residual stenosis.  The left ventricular systolic function is normal.  LV end diastolic pressure is normal.   1. Acute anterior STEMI secondary to thrombotic occlusion of the mid LAD 2. Successful PTCA/DES x 2 mid LAD 3. Severe stenosis first obtuse marginal branch 4. The RCA is a large dominant vessel with a patent proximal stented segment. There is moderate restenosis in the stents. Severe distal RCA stenosis.  5. Segmental LV systolic dysfunction. Hand injection of contrast and unable to fully  comment on LVEF.   Recommendations: Will admit to the ICU. Will continue Aggrastat drip x 2 hours. Will continue DAPT with ASA and Brilinta for at least 12 months. Continue beta blocker. Will start high intensity statin. Echo in the am. Will need staged PCI of the RCA and the Circumflex/OM lesions next week. He is followed by Dr. Harwani. Dr. Harwani is aware of his admission.   Patient Profile     59 y.o. 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  Assessment & Plan    1.  Acute anterior STEMI: Patient appropriately started on aspirin, ticagrelor, metoprolol, and a high intensity statin drug.  Plans for staged PCI today discussed with the patient.  He will require intervention of the obtuse marginal and right coronary arteries.  I have personally reviewed his cardiac catheterization films which show very high-grade stenoses in both of those vessels. 2.  Ischemic cardiomyopathy, anteroapical akinesis consistent with his anterior MI, LVEF 40%: Currently on metoprolol.  Need to add ACE/ARB, but will await cardiac catheterization today with plans to initiate this tomorrow.  His blood pressure has been low at times with an early morning blood pressure today of 93/75.  Blood pressure too low to start ACE/ARB today. 3.  Mixed hyperlipidemia: LDL cholesterol 96 mg/dL.  Started on high intensity statin drug. 4. Pt complaining of R shoulder pain with musculoskeletal characteristics. Will check XRay.  Will touch base with Dr Harwani to make sure he is notified of the patient's admission. As long as he is agreeable, will arrange for staged PCI of the OM and RCA today.   For questions or updates, please contact [facility] Please consult [email or web address] for contact info under        Signed, Rowan Blaker, MD  10/20/2019, 8:38 AM    

## 2019-10-20 NOTE — Interval H&P Note (Signed)
History and Physical Interval Note:  10/20/2019 11:49 AM  Jeremy [name]  has presented today for surgery, with the diagnosis of CAD.  The various methods of treatment have been discussed with the patient and family. After consideration of risks, benefits and other options for treatment, the patient has consented to  Procedure(s): CORONARY STENT INTERVENTION (N/A) as a surgical intervention.  The patient's history has been reviewed, patient examined, no change in status, stable for surgery.  I have reviewed the patient's chart and labs.  Questions were answered to the patient's satisfaction.    Cath Lab Visit (complete for each Cath Lab visit)  Clinical Evaluation Leading to the Procedure:   ACS: Yes.    Non-ACS:    Anginal Classification: CCS IV  Anti-ischemic medical therapy: Minimal Therapy (1 class of medications)  Non-Invasive Test Results: No non-invasive testing performed  Prior CABG: No previous CABG        Jeremy [name]

## 2019-10-20 NOTE — Progress Notes (Signed)
Progress Note  Patient Name: Jeremy FredricksonStephen W Hemler Date of Encounter: 10/20/2019  [facility] Cardiologist: No primary care provider on file.   Subjective   No CP or dyspnea. Complains of right shoulder ache which has been constant since the cath/PCI procedure.  Inpatient Medications    Scheduled Meds: . aspirin EC  81 mg Oral Daily  . atorvastatin  80 mg Oral Daily  . Chlorhexidine Gluconate Cloth  6 each Topical Daily  . metoprolol tartrate  25 mg Oral BID  . sodium chloride flush  3 mL Intravenous Q12H  . ticagrelor  90 mg Oral BID   Continuous Infusions: . sodium chloride     PRN Meds: sodium chloride, acetaminophen, ondansetron (ZOFRAN) IV, oxyCODONE, sodium chloride flush, zolpidem   Vital Signs    Vitals:   10/20/19 0600 10/20/19 0700 10/20/19 0800 10/20/19 0833  BP: 124/81     Pulse: 65 74    Resp: 20 (!) 27 17   Temp:    98.7 F (37.1 C)  TempSrc:    Oral  SpO2: 98% 98%    Weight:      Height:        Intake/Output Summary (Last 24 hours) at 10/20/2019 0838 Last data filed at 10/20/2019 0800 Gross per 24 hour  Intake 720 ml  Output 1800 ml  Net -1080 ml   Last 3 Weights 10/19/2019 10/18/2019 09/28/2018  Weight (lbs) 163 lb 9.3 oz 158 lb 171 lb 12.8 oz  Weight (kg) 74.2 kg 71.668 kg 77.928 kg      Telemetry    Sinus rhythm - Personally Reviewed   Physical Exam  Alert, oriented, NAD GEN: No acute distress.   Neck: No JVD Cardiac: RRR, no murmurs, rubs, or gallops.  Respiratory: Clear to auscultation bilaterally. GI: Soft, nontender, non-distended  MS: No edema; No deformity. Mild shoulder tenderness to palpation. Neuro:  Nonfocal  Psych: Normal affect   Labs    High Sensitivity Troponin:   Recent Labs  Lab 10/18/19 1541 10/18/19 1906  TROPONINIHS 22* 17,557*      Chemistry Recent Labs  Lab 10/18/19 1541 10/18/19 1541 10/18/19 1555 10/18/19 1607 10/19/19 0433 10/20/19 0156  NA 140   < >  --  142 139 138  K 4.2   < >  --  3.9  3.6 4.6  CL 108   < >  --  107 108 105  CO2 21*  --   --   --  20* 24  GLUCOSE 110*   < >  --  106* 92 102*  BUN 25*   < >  --  28* 15 13  CREATININE 1.13   < >  --  0.90 0.81 0.86  CALCIUM 9.8  --   --   --  9.1 9.3  PROT  --   --  7.2  --   --   --   ALBUMIN  --   --  4.5  --   --   --   AST  --   --  29  --   --   --   ALT  --   --  38  --   --   --   ALKPHOS  --   --  62  --   --   --   BILITOT  --   --  0.9  --   --   --   GFRNONAA >60  --   --   --  >  60 >60  GFRAA >60  --   --   --  >60 >60  ANIONGAP 11  --   --   --  11 9   < > = values in this interval not displayed.     Hematology Recent Labs  Lab 10/18/19 1541 10/18/19 1541 10/18/19 1607 10/19/19 0433 10/20/19 0156  WBC 9.7  --   --  9.1 7.7  RBC 5.16  --   --  4.65 4.89  HGB 15.5   < > 14.6 13.8 14.5  HCT 45.3   < > 43.0 40.0 43.2  MCV 87.8  --   --  86.0 88.3  MCH 30.0  --   --  29.7 29.7  MCHC 34.2  --   --  34.5 33.6  RDW 11.8  --   --  11.9 12.2  PLT 280  --   --  243 278   < > = values in this interval not displayed.    BNPNo results for input(s): BNP, PROBNP in the last 168 hours.   DDimer No results for input(s): DDIMER in the last 168 hours.   Radiology    CARDIAC CATHETERIZATION  Result Date: 10/18/2019  Prox RCA lesion is 65% stenosed.  Mid RCA lesion is 50% stenosed.  Dist RCA lesion is 99% stenosed.  1st Mrg lesion is 80% stenosed.  Mid LAD-1 lesion is 70% stenosed.  Mid LAD-2 lesion is 100% stenosed.  A drug-eluting stent was successfully placed using a SYNERGY XD 2.75X24.  Post intervention, there is a 0% residual stenosis.  A drug-eluting stent was successfully placed using a SYNERGY XD 2.75X12.  Post intervention, there is a 0% residual stenosis.  The left ventricular systolic function is normal.  LV end diastolic pressure is normal.  1. Acute anterior STEMI secondary to thrombotic occlusion of the mid LAD 2. Successful PTCA/DES x 2 mid LAD 3. Severe stenosis first obtuse marginal  branch 4. The RCA is a large dominant vessel with a patent proximal stented segment. There is moderate restenosis in the stents. Severe distal RCA stenosis. 5. Segmental LV systolic dysfunction. Hand injection of contrast and unable to fully comment on LVEF. Recommendations: Will admit to the ICU. Will continue Aggrastat drip x 2 hours. Will continue DAPT with ASA and Brilinta for at least 12 months. Continue beta blocker. Will start high intensity statin. Echo in the am. Will need staged PCI of the RCA and the Circumflex/OM lesions next week. He is followed by Dr. Sharyn Lull. Dr. Sharyn Lull is aware of his admission.   DG Chest Portable 1 View  Result Date: 10/18/2019 CLINICAL DATA:  STEMI. EXAM: PORTABLE CHEST 1 VIEW COMPARISON:  02/11/2008 FINDINGS: Lungs are well inflated and otherwise clear. Cardiomediastinal silhouette and remainder of the exam is unchanged. IMPRESSION: No active disease. Electronically Signed   By: Elberta Fortis M.D.   On: 10/18/2019 16:20   ECHOCARDIOGRAM COMPLETE  Result Date: 10/19/2019    ECHOCARDIOGRAM REPORT   Patient Name:   Jeremy [name] Date of Exam: 10/19/2019 Medical Rec #:  [id number]        Height:       68.0 in Accession #:    [id number]       Weight:       163.6 lb Date of Birth:  April 19, 1960        BSA:          1.876 m Patient Age:    59 years  BP:           109/74 mmHg Patient Gender: M                HR:           60 bpm. Exam Location:  Inpatient Procedure: 2D Echo, Cardiac Doppler and Color Doppler Indications:    STEMI  History:        Patient has no prior history of Echocardiogram examinations.                 CAD; Risk Factors:Former Smoker.  Sonographer:    Ross Ludwig RDCS (AE) Referring Phys: [id number] CHRISTOPHER D [facility]  Sonographer Comments: Suboptimal parasternal window and suboptimal subcostal window. Patient having right shoulder pain and moving frequently during echo. IMPRESSIONS  1. The apex is akinetic. Marland Kitchen Left ventricular ejection fraction, by  estimation, is 40%. The left ventricle has mildly decreased function. The left ventricle demonstrates regional wall motion abnormalities (see scoring diagram/findings for description). There is moderate left ventricular hypertrophy. Left ventricular diastolic parameters were normal.  2. Right ventricular systolic function was not well visualized. The right ventricular size is not well visualized.  3. The mitral valve was not well visualized. No evidence of mitral valve regurgitation. No evidence of mitral stenosis.  4. The aortic valve was not well visualized. Aortic valve regurgitation is not visualized. No aortic stenosis is present.  5. Aortic dilatation noted. There is mild dilatation of the aortic root, measuring 41 mm. FINDINGS  Left Ventricle: The apex is akinetic. Left ventricular ejection fraction, by estimation, is 40%. The left ventricle has mildly decreased function. The left ventricle demonstrates regional wall motion abnormalities. The left ventricular internal cavity size was normal in size. There is moderate left ventricular hypertrophy. Left ventricular diastolic parameters were normal. Right Ventricle: The right ventricular size is not well visualized. Right vetricular wall thickness was not well visualized. Right ventricular systolic function was not well visualized. Left Atrium: Left atrial size was normal in size. Right Atrium: Right atrial size was normal in size. Pericardium: There is no evidence of pericardial effusion. Mitral Valve: The mitral valve was not well visualized. No evidence of mitral valve regurgitation. No evidence of mitral valve stenosis. MV peak gradient, 3.3 mmHg. The mean mitral valve gradient is 1.0 mmHg. Tricuspid Valve: The tricuspid valve is normal in structure. Tricuspid valve regurgitation is not demonstrated. No evidence of tricuspid stenosis. Aortic Valve: The aortic valve was not well visualized. Aortic valve regurgitation is not visualized. No aortic stenosis is  present. Aortic valve mean gradient measures 4.8 mmHg. Aortic valve peak gradient measures 8.9 mmHg. Aortic valve area, by VTI measures 3.04 cm. Pulmonic Valve: The pulmonic valve was not well visualized. Pulmonic valve regurgitation is not visualized. No evidence of pulmonic stenosis. Aorta: Aortic dilatation noted. There is mild dilatation of the aortic root, measuring 41 mm. Venous: The inferior vena cava was not well visualized. IAS/Shunts: No atrial level shunt detected by color flow Doppler.  LEFT VENTRICLE PLAX 2D LVIDd:         4.10 cm  Diastology LVIDs:         3.00 cm  LV e' medial:    7.62 cm/s LV PW:         1.40 cm  LV E/e' medial:  10.9 LV IVS:        1.40 cm  LV e' lateral:   10.90 cm/s LVOT diam:     2.30 cm  LV E/e' lateral: 7.6  LV SV:         79 LV SV Index:   42 LVOT Area:     4.15 cm  RIGHT VENTRICLE RV Basal diam:  3.30 cm RV S prime:     12.80 cm/s TAPSE (M-mode): 2.4 cm LEFT ATRIUM             Index       RIGHT ATRIUM           Index LA diam:        2.80 cm 1.49 cm/m  RA Area:     14.50 cm LA Vol (A2C):   59.6 ml 31.76 ml/m RA Volume:   36.30 ml  19.35 ml/m LA Vol (A4C):   51.2 ml 27.29 ml/m LA Biplane Vol: 55.5 ml 29.58 ml/m  AORTIC VALVE AV Area (Vmax):    2.75 cm AV Area (Vmean):   2.39 cm AV Area (VTI):     3.04 cm AV Vmax:           149.31 cm/s AV Vmean:          102.331 cm/s AV VTI:            0.259 m AV Peak Grad:      8.9 mmHg AV Mean Grad:      4.8 mmHg LVOT Vmax:         98.93 cm/s LVOT Vmean:        58.862 cm/s LVOT VTI:          0.190 m LVOT/AV VTI ratio: 0.73  AORTA Ao Root diam: 4.10 cm MITRAL VALVE MV Area (PHT): 2.80 cm    SHUNTS MV Peak grad:  3.3 mmHg    Systemic VTI:  0.19 m MV Mean grad:  1.0 mmHg    Systemic Diam: 2.30 cm MV Vmax:       0.90 m/s MV Vmean:      53.8 cm/s MV Decel Time: 271 msec MV E velocity: 83.10 cm/s MV A velocity: 83.60 cm/s MV E/A ratio:  0.99 Dina Rich MD Electronically signed by Dina Rich MD Signature Date/Time:  10/19/2019/2:57:17 PM    Final     Cardiac Studies   2D echocardiogram: IMPRESSIONS    1. The apex is akinetic. Marland Kitchen Left ventricular ejection fraction, by  estimation, is 40%. The left ventricle has mildly decreased function. The  left ventricle demonstrates regional wall motion abnormalities (see  scoring diagram/findings for description).  There is moderate left ventricular hypertrophy. Left ventricular diastolic  parameters were normal.  2. Right ventricular systolic function was not well visualized. The right  ventricular size is not well visualized.  3. The mitral valve was not well visualized. No evidence of mitral valve  regurgitation. No evidence of mitral stenosis.  4. The aortic valve was not well visualized. Aortic valve regurgitation  is not visualized. No aortic stenosis is present.  5. Aortic dilatation noted. There is mild dilatation of the aortic root,  measuring 41 mm.  Cardiac catheterization 10/18/2019: Conclusion    Prox RCA lesion is 65% stenosed.  Mid RCA lesion is 50% stenosed.  Dist RCA lesion is 99% stenosed.  1st Mrg lesion is 80% stenosed.  Mid LAD-1 lesion is 70% stenosed.  Mid LAD-2 lesion is 100% stenosed.  A drug-eluting stent was successfully placed using a SYNERGY XD 2.75X24.  Post intervention, there is a 0% residual stenosis.  A drug-eluting stent was successfully placed using a SYNERGY XD 2.75X12.  Post intervention, there is a 0%  residual stenosis.  The left ventricular systolic function is normal.  LV end diastolic pressure is normal.   1. Acute anterior STEMI secondary to thrombotic occlusion of the mid LAD 2. Successful PTCA/DES x 2 mid LAD 3. Severe stenosis first obtuse marginal branch 4. The RCA is a large dominant vessel with a patent proximal stented segment. There is moderate restenosis in the stents. Severe distal RCA stenosis.  5. Segmental LV systolic dysfunction. Hand injection of contrast and unable to fully  comment on LVEF.   Recommendations: Will admit to the ICU. Will continue Aggrastat drip x 2 hours. Will continue DAPT with ASA and Brilinta for at least 12 months. Continue beta blocker. Will start high intensity statin. Echo in the am. Will need staged PCI of the RCA and the Circumflex/OM lesions next week. He is followed by Dr. Sharyn Lull. Dr. Sharyn Lull is aware of his admission.   Patient Profile     59 y.o. 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  Assessment & Plan    1.  Acute anterior STEMI: Patient appropriately started on aspirin, ticagrelor, metoprolol, and a high intensity statin drug.  Plans for staged PCI today discussed with the patient.  He will require intervention of the obtuse marginal and right coronary arteries.  I have personally reviewed his cardiac catheterization films which show very high-grade stenoses in both of those vessels. 2.  Ischemic cardiomyopathy, anteroapical akinesis consistent with his anterior MI, LVEF 40%: Currently on metoprolol.  Need to add ACE/ARB, but will await cardiac catheterization today with plans to initiate this tomorrow.  His blood pressure has been low at times with an early morning blood pressure today of 93/75.  Blood pressure too low to start ACE/ARB today. 3.  Mixed hyperlipidemia: LDL cholesterol 96 mg/dL.  Started on high intensity statin drug. 4. Pt complaining of R shoulder pain with musculoskeletal characteristics. Will check XRay.  Will touch base with Dr Sharyn Lull to make sure he is notified of the patient's admission. As long as he is agreeable, will arrange for staged PCI of the OM and RCA today.   For questions or updates, please contact [facility] Please consult [email or web address] for contact info under        Signed, Tonny Bollman, MD  10/20/2019, 8:38 AM

## 2019-10-21 DIAGNOSIS — I25110 Atherosclerotic heart disease of native coronary artery with unstable angina pectoris: Secondary | ICD-10-CM | POA: Diagnosis not present

## 2019-10-21 DIAGNOSIS — I10 Essential (primary) hypertension: Secondary | ICD-10-CM

## 2019-10-21 DIAGNOSIS — E785 Hyperlipidemia, unspecified: Secondary | ICD-10-CM

## 2019-10-21 LAB — BASIC METABOLIC PANEL
Anion gap: 9 (ref 5–15)
BUN: 10 mg/dL (ref 6–20)
CO2: 26 mmol/L (ref 22–32)
Calcium: 9.3 mg/dL (ref 8.9–10.3)
Chloride: 102 mmol/L (ref 98–111)
Creatinine, Ser: 0.97 mg/dL (ref 0.61–1.24)
GFR calc Af Amer: >60 mL/min (ref >60)
GFR calc non Af Amer: >60 mL/min (ref >60)
Glucose, Bld: 110 mg/dL — ABNORMAL HIGH (ref 70–99)
Potassium: 3.9 mmol/L (ref 3.5–5.1)
Sodium: 137 mmol/L (ref 135–145)

## 2019-10-21 LAB — CBC
HCT: 41.1 % (ref 39.0–52.0)
Hemoglobin: 14.1 g/dL (ref 13.0–17.0)
MCH: 30.4 pg (ref 26.0–34.0)
MCHC: 34.3 g/dL (ref 30.0–36.0)
MCV: 88.6 fL (ref 80.0–100.0)
Platelets: 274 10*3/uL (ref 150–400)
RBC: 4.64 MIL/uL (ref 4.22–5.81)
RDW: 12.2 % (ref 11.5–15.5)
WBC: 8.4 10*3/uL (ref 4.0–10.5)
nRBC: 0 % (ref 0.0–0.2)

## 2019-10-21 LAB — POCT ACTIVATED CLOTTING TIME: Activated Clotting Time: 219 seconds

## 2019-10-21 MED ORDER — ASPIRIN 81 MG PO TBEC: 81.0000 mg | DELAYED_RELEASE_TABLET | Freq: Every day | ORAL | 11 refills | Status: AC

## 2019-10-21 MED ORDER — TICAGRELOR 90 MG PO TABS
90.0000 mg | ORAL_TABLET | Freq: Two times a day (BID) | ORAL | 11 refills | Status: DC
Start: 2019-10-21 — End: 2021-08-08

## 2019-10-21 MED ORDER — NITROGLYCERIN 0.4 MG SL SUBL: 0.4000 mg | SUBLINGUAL_TABLET | SUBLINGUAL | 3 refills | Status: AC | PRN

## 2019-10-21 MED ORDER — ATORVASTATIN CALCIUM 80 MG PO TABS: 80.0000 mg | ORAL_TABLET | Freq: Every day | ORAL | 11 refills | Status: AC

## 2019-10-21 MED FILL — Nitroglycerin IV Soln 100 MCG/ML in D5W: INTRA_ARTERIAL | Qty: 10 | Status: AC

## 2019-10-21 MED FILL — ATORVASTATIN CALCIUM 80 MG: 80 | 30 days supply | Qty: 30 | Fill #0

## 2019-10-21 MED FILL — ASPIRIN LOW DOSE 81 MG TBEC: 81 | 30 days supply | Qty: 30 | Fill #0

## 2019-10-21 MED FILL — NITROGLYCERIN 0.4 MG TAB SL: 0.4 | 7 days supply | Qty: 25 | Fill #0

## 2019-10-21 MED FILL — BRILINTA 90 MG TABLET: 90 | 30 days supply | Qty: 60 | Fill #0

## 2019-10-21 NOTE — TOC Progression Note (Addendum)
Transition of Care [facility]) - Progression Note    Patient Details  Name: Jeremy [name] MRN: [id number] Date of Birth: 11/27/1960  Transition of Care [facility] CM/SW Contact  Beckie Busing, RN Phone Number: [phone]  10/21/2019, 11:30 AM  Clinical Narrative:    Benefit check for [employer]ir has been entered. Brilenta discount card tubed to unit nurse made aware.   1137 Bedside nurse called CM to report that patient is ready to go. Patient has a 30 days supply of meds that has been delivered by [facility] pharmacy. A  30 day supply Brilenta card has been sent to the unit to be given to the patient by the bedside nurse. Patient is not willing to wait for benefit check. Nurse states that he will have another appointment with his physician prior to running out of medications.  CM will sign off.         Expected Discharge Plan and Services           Expected Discharge Date: 10/21/19                                     Social Determinants of Health (SDOH) Interventions    Readmission Risk Interventions No flowsheet data found.

## 2019-10-21 NOTE — Discharge Summary (Addendum)
Discharge Summary    Patient ID: Jeremy [name],  MRN: [id number], DOB/AGE: 1960-10-28 58 y.o.  Admit date: 10/18/2019 Discharge date: 10/21/2019  Primary Care Provider: Kaleen Mask Primary Cardiologist: Dr. Sharyn Lull  Discharge Diagnoses    Principal Problem:   Acute ST elevation myocardial infarction (STEMI) of anterior wall [facility]) Active Problems:   Acute ST elevation myocardial infarction (STEMI) due to occlusion of left anterior descending (LAD) coronary artery [facility])   Coronary artery disease involving native coronary artery of native heart with unstable angina pectoris (HCC)   Hyperlipidemia   Hypertension   Allergies No Known Allergies  Diagnostic Studies/Procedures    Cath: 10/18/19   Prox RCA lesion is 65% stenosed.  Mid RCA lesion is 50% stenosed.  Dist RCA lesion is 99% stenosed.  1st Mrg lesion is 80% stenosed.  Mid LAD-1 lesion is 70% stenosed.  Mid LAD-2 lesion is 100% stenosed.  A drug-eluting stent was successfully placed using a SYNERGY XD 2.75X24.  Post intervention, there is a 0% residual stenosis.  A drug-eluting stent was successfully placed using a SYNERGY XD 2.75X12.  Post intervention, there is a 0% residual stenosis.  The left ventricular systolic function is normal.  LV end diastolic pressure is normal.   1. Acute anterior STEMI secondary to thrombotic occlusion of the mid LAD 2. Successful PTCA/DES x 2 mid LAD 3. Severe stenosis first obtuse marginal branch 4. The RCA is a large dominant vessel with a patent proximal stented segment. There is moderate restenosis in the stents. Severe distal RCA stenosis.  5. Segmental LV systolic dysfunction. Hand injection of contrast and unable to fully comment on LVEF.   Recommendations: Will admit to the ICU. Will continue Aggrastat drip x 2 hours. Will continue DAPT with ASA and Brilinta for at least 12 months. Continue beta blocker. Will start high intensity statin. Echo in the  am. Will need staged PCI of the RCA and the Circumflex/OM lesions next week. He is followed by Dr. Sharyn Lull. Dr. Sharyn Lull is aware of his admission.   Diagnostic Dominance: Right  Intervention    Cath: 10/20/19   Previously placed Mid LAD-2 drug eluting stent is widely patent.  Balloon angioplasty was performed.  Previously placed Mid LAD-1 drug eluting stent is widely patent.  Balloon angioplasty was performed.  1st Mrg lesion is 80% stenosed.  Prox RCA lesion is 65% stenosed.  Mid RCA lesion is 50% stenosed.  Dist RCA lesion is 99% stenosed.  Scoring balloon angioplasty was performed using a BALLOON WOLVERINE 3.00X15.  Post intervention, there is a 30% residual stenosis.  A drug-eluting stent was successfully placed using a SYNERGY XD 3.50X16.  Post intervention, there is a 0% residual stenosis.  A drug-eluting stent was successfully placed using a SYNERGY XD 2.75X16.  Post intervention, there is a 0% residual stenosis.   1. Severe stenosis distal RCA. Successful PTCA/DES x 1 distal RCA 2. Moderate to severe restenosis in the proximal to mid RCA. Successful scoring balloon angioplasty proximal to mid RCA.  3. Severe stenosis first obtuse marginal branch. Successful PTCA/DES x 1 OM1.   Recommendations: Continue DAPT with ASA and Brilinta for one year. Consider continuing DAPT for lifetime given his first generation DES in the RCA and now with multi-vessel CAD. Continue statin and beta blocker. Likely d/c home tomorrow.   Diagnostic Dominance: Right  Intervention     Echo: 10/19/2019  IMPRESSIONS    1. The apex is akinetic. Marland Kitchen Left ventricular ejection fraction, by  estimation, is 40%. The left ventricle has mildly decreased function. The  left ventricle demonstrates regional wall motion abnormalities (see  scoring diagram/findings for description).  There is moderate left ventricular hypertrophy. Left ventricular diastolic  parameters were normal.  2.  Right ventricular systolic function was not well visualized. The right  ventricular size is not well visualized.  3. The mitral valve was not well visualized. No evidence of mitral valve  regurgitation. No evidence of mitral stenosis.  4. The aortic valve was not well visualized. Aortic valve regurgitation  is not visualized. No aortic stenosis is present.  5. Aortic dilatation noted. There is mild dilatation of the aortic root,  measuring 41 mm.  _____________   History of Present Illness     Jeremy [name] is a 59 y.o. male with history of CAD with inferior STEMI in 2008 (2 overlapping Cypher drug eluting stents placed in the proximal/mid RCA at that time), HTN, HLD and former tobacco abuse who presented to the ED  with c/o chest pain after working in his yard. He became overheated and had the onset of severe chest pain with worsened diaphoresis. No dyspnea. EKG with inferior and anterolateral ST elevation. Code STEMI called by the ED staff. Pt with ongoing but improved chest pain at the time of my examination in the ED. He was taken to the cath lab emergently.   Hospital Course     1.  Acute anterior STEMI: Underwent cardiac cath 9/18 with successful PTCA/DESx2 to mLAD. Residual disease in a large dominant RCA and OM. Underwent staged intervention with PCI/DES x1 along with moderate to severe restenosis to the [facility] with scoring balloon angioplasty. Also PCI/DESx1 to OM. Patient was appropriately started on aspirin, ticagrelor, metoprolol, and a high intensity statin drug.  No recurrent chest pain. Worked well with cardiac rehab.  -- ASA, Brilinta, Statin and BB  2.  Ischemic cardiomyopathy, anteroapical akinesis consistent with his anterior MI, LVEF 40%: Currently on metoprolol.  Need to add ACE/ARB, but will have to reassess as an outpatient as his blood pressures have been low at times. No volume overload noted on exam.  3.  Mixed hyperlipidemia: LDL cholesterol 96 mg/dL.  Started on  high intensity statin drug. -- FLP/LFTs in 8 weeks  Educated by PharmD prior to discharge. Medications were filled through the [facility] pharmacy.  General: Well developed, well nourished, male appearing in no acute distress. Head: Normocephalic, atraumatic.  Neck: Supple without bruits, JVD. Lungs:  Resp regular and unlabored, CTA. Heart: RRR, S1, S2, no S3, S4, or murmur; no rub. Abdomen: Soft, non-tender, non-distended with normoactive bowel sounds. No hepatomegaly. No rebound/guarding. No obvious abdominal masses. Extremities: No clubbing, cyanosis, edema. Distal pedal pulses are 2+ bilaterally. Right cath site stable without bruising or hematoma Neuro: Alert and oriented X 3. Moves all extremities spontaneously. Psych: Normal affect.  _____________  Discharge Vitals Blood pressure (!) 94/47, pulse 70, temperature 97.6 F (36.4 C), temperature source Oral, resp. rate 16, height 5\' 8"  (1.727 m), weight 74.5 kg, SpO2 99 %.  Filed Weights   10/19/19 0640 10/20/19 1125 10/21/19 0441  Weight: 74.2 kg 75.2 kg 74.5 kg    Labs & Radiologic Studies    CBC Recent Labs    10/20/19 0156 10/21/19 0301  WBC 7.7 8.4  HGB 14.5 14.1  HCT 43.2 41.1  MCV 88.3 88.6  PLT 278 274   Basic Metabolic Panel Recent Labs    10/23/19 0156 10/21/19 0301  NA 138 137  K 4.6 3.9  CL 105 102  CO2 24 26  GLUCOSE 102* 110*  BUN 13 10  CREATININE 0.86 0.97  CALCIUM 9.3 9.3   Liver Function Tests Recent Labs    10/18/19 1555  AST 29  ALT 38  ALKPHOS 62  BILITOT 0.9  PROT 7.2  ALBUMIN 4.5   No results for input(s): LIPASE, AMYLASE in the last 72 hours. Cardiac Enzymes No results for input(s): CKTOTAL, CKMB, CKMBINDEX, TROPONINI in the last 72 hours. BNP Invalid input(s): POCBNP D-Dimer No results for input(s): DDIMER in the last 72 hours. Hemoglobin A1C Recent Labs    10/18/19 1906  HGBA1C 5.1   Fasting Lipid Panel Recent Labs    10/18/19 1555  CHOL 151  HDL 39*  LDLCALC 96    TRIG 79  CHOLHDL 3.9   Thyroid Function Tests No results for input(s): TSH, T4TOTAL, T3FREE, THYROIDAB in the last 72 hours.  Invalid input(s): FREET3 _____________  DG Shoulder Right  Result Date: 10/20/2019 CLINICAL DATA:  59 year old male with history of right shoulder pain EXAM: RIGHT SHOULDER - 2+ VIEW COMPARISON:  None. FINDINGS: The glenohumeral joint appears congruent, however, there is increased acromial-humeral interval, estimated 12 mm-13 mm on the AP image. Degenerative changes of the acromioclavicular joint. No acute displaced fracture.  No radiopaque foreign body. IMPRESSION: No acute displaced fracture. The acromial-humeral interval is increased which can be seen with joint effusion or less likely GH joint subluxation. Degenerative changes of the acromioclavicular joint. Electronically Signed   By: Gilmer Mor D.O.   On: 10/20/2019 11:32   CARDIAC CATHETERIZATION  Result Date: 10/20/2019  Previously placed Mid LAD-2 drug eluting stent is widely patent.  Balloon angioplasty was performed.  Previously placed Mid LAD-1 drug eluting stent is widely patent.  Balloon angioplasty was performed.  1st Mrg lesion is 80% stenosed.  Prox RCA lesion is 65% stenosed.  Mid RCA lesion is 50% stenosed.  Dist RCA lesion is 99% stenosed.  Scoring balloon angioplasty was performed using a BALLOON WOLVERINE 3.00X15.  Post intervention, there is a 30% residual stenosis.  A drug-eluting stent was successfully placed using a SYNERGY XD 3.50X16.  Post intervention, there is a 0% residual stenosis.  A drug-eluting stent was successfully placed using a SYNERGY XD 2.75X16.  Post intervention, there is a 0% residual stenosis.  1. Severe stenosis distal RCA. Successful PTCA/DES x 1 distal RCA 2. Moderate to severe restenosis in the proximal to mid RCA. Successful scoring balloon angioplasty proximal to mid RCA. 3. Severe stenosis first obtuse marginal branch. Successful PTCA/DES x 1 OM1.  Recommendations: Continue DAPT with ASA and Brilinta for one year. Consider continuing DAPT for lifetime given his first generation DES in the RCA and now with multi-vessel CAD. Continue statin and beta blocker. Likely d/c home tomorrow.   CARDIAC CATHETERIZATION  Result Date: 10/18/2019  Prox RCA lesion is 65% stenosed.  Mid RCA lesion is 50% stenosed.  Dist RCA lesion is 99% stenosed.  1st Mrg lesion is 80% stenosed.  Mid LAD-1 lesion is 70% stenosed.  Mid LAD-2 lesion is 100% stenosed.  A drug-eluting stent was successfully placed using a SYNERGY XD 2.75X24.  Post intervention, there is a 0% residual stenosis.  A drug-eluting stent was successfully placed using a SYNERGY XD 2.75X12.  Post intervention, there is a 0% residual stenosis.  The left ventricular systolic function is normal.  LV end diastolic pressure is normal.  1. Acute anterior STEMI secondary to thrombotic occlusion of the mid  LAD 2. Successful PTCA/DES x 2 mid LAD 3. Severe stenosis first obtuse marginal branch 4. The RCA is a large dominant vessel with a patent proximal stented segment. There is moderate restenosis in the stents. Severe distal RCA stenosis. 5. Segmental LV systolic dysfunction. Hand injection of contrast and unable to fully comment on LVEF. Recommendations: Will admit to the ICU. Will continue Aggrastat drip x 2 hours. Will continue DAPT with ASA and Brilinta for at least 12 months. Continue beta blocker. Will start high intensity statin. Echo in the am. Will need staged PCI of the RCA and the Circumflex/OM lesions next week. He is followed by Dr. Sharyn Lull. Dr. Sharyn Lull is aware of his admission.   DG Chest Portable 1 View  Result Date: 10/18/2019 CLINICAL DATA:  STEMI. EXAM: PORTABLE CHEST 1 VIEW COMPARISON:  02/11/2008 FINDINGS: Lungs are well inflated and otherwise clear. Cardiomediastinal silhouette and remainder of the exam is unchanged. IMPRESSION: No active disease. Electronically Signed   By: Elberta Fortis M.D.   On: 10/18/2019 16:20   ECHOCARDIOGRAM COMPLETE  Result Date: 10/19/2019    ECHOCARDIOGRAM REPORT   Patient Name:   Jeremy [name] Date of Exam: 10/19/2019 Medical Rec #:  [id number]        Height:       68.0 in Accession #:    [id number]       Weight:       163.6 lb Date of Birth:  Jan 30, 1961        BSA:          1.876 m Patient Age:    59 years         BP:           109/74 mmHg Patient Gender: M                HR:           60 bpm. Exam Location:  Inpatient Procedure: 2D Echo, Cardiac Doppler and Color Doppler Indications:    STEMI  History:        Patient has no prior history of Echocardiogram examinations.                 CAD; Risk Factors:Former [name].  Sonographer:    Ross Ludwig RDCS (AE) Referring Phys: [id number] CHRISTOPHER D [facility]  Sonographer Comments: Suboptimal parasternal window and suboptimal subcostal window. Patient having right shoulder pain and moving frequently during echo. IMPRESSIONS  1. The apex is akinetic. Marland Kitchen Left ventricular ejection fraction, by estimation, is 40%. The left ventricle has mildly decreased function. The left ventricle demonstrates regional wall motion abnormalities (see scoring diagram/findings for description). There is moderate left ventricular hypertrophy. Left ventricular diastolic parameters were normal.  2. Right ventricular systolic function was not well visualized. The right ventricular size is not well visualized.  3. The mitral valve was not well visualized. No evidence of mitral valve regurgitation. No evidence of mitral stenosis.  4. The aortic valve was not well visualized. Aortic valve regurgitation is not visualized. No aortic stenosis is present.  5. Aortic dilatation noted. There is mild dilatation of the aortic root, measuring 41 mm. FINDINGS  Left Ventricle: The apex is akinetic. Left ventricular ejection fraction, by estimation, is 40%. The left ventricle has mildly decreased function. The left ventricle demonstrates regional wall motion  abnormalities. The left ventricular internal cavity size was normal in size. There is moderate left ventricular hypertrophy. Left ventricular diastolic parameters were normal. Right Ventricle: The right ventricular size  is not well visualized. Right vetricular wall thickness was not well visualized. Right ventricular systolic function was not well visualized. Left Atrium: Left atrial size was normal in size. Right Atrium: Right atrial size was normal in size. Pericardium: There is no evidence of pericardial effusion. Mitral Valve: The mitral valve was not well visualized. No evidence of mitral valve regurgitation. No evidence of mitral valve stenosis. MV peak gradient, 3.3 mmHg. The mean mitral valve gradient is 1.0 mmHg. Tricuspid Valve: The tricuspid valve is normal in structure. Tricuspid valve regurgitation is not demonstrated. No evidence of tricuspid stenosis. Aortic Valve: The aortic valve was not well visualized. Aortic valve regurgitation is not visualized. No aortic stenosis is present. Aortic valve mean gradient measures 4.8 mmHg. Aortic valve peak gradient measures 8.9 mmHg. Aortic valve area, by VTI measures 3.04 cm. Pulmonic Valve: The pulmonic valve was not well visualized. Pulmonic valve regurgitation is not visualized. No evidence of pulmonic stenosis. Aorta: Aortic dilatation noted. There is mild dilatation of the aortic root, measuring 41 mm. Venous: The inferior vena cava was not well visualized. IAS/Shunts: No atrial level shunt detected by color flow Doppler.  LEFT VENTRICLE PLAX 2D LVIDd:         4.10 cm  Diastology LVIDs:         3.00 cm  LV e' medial:    7.62 cm/s LV PW:         1.40 cm  LV E/e' medial:  10.9 LV IVS:        1.40 cm  LV e' lateral:   10.90 cm/s LVOT diam:     2.30 cm  LV E/e' lateral: 7.6 LV SV:         79 LV SV Index:   42 LVOT Area:     4.15 cm  RIGHT VENTRICLE RV Basal diam:  3.30 cm RV S prime:     12.80 cm/s TAPSE (M-mode): 2.4 cm LEFT ATRIUM             Index        RIGHT ATRIUM           Index LA diam:        2.80 cm 1.49 cm/m  RA Area:     14.50 cm LA Vol (A2C):   59.6 ml 31.76 ml/m RA Volume:   36.30 ml  19.35 ml/m LA Vol (A4C):   51.2 ml 27.29 ml/m LA Biplane Vol: 55.5 ml 29.58 ml/m  AORTIC VALVE AV Area (Vmax):    2.75 cm AV Area (Vmean):   2.39 cm AV Area (VTI):     3.04 cm AV Vmax:           149.31 cm/s AV Vmean:          102.331 cm/s AV VTI:            0.259 m AV Peak Grad:      8.9 mmHg AV Mean Grad:      4.8 mmHg LVOT Vmax:         98.93 cm/s LVOT Vmean:        58.862 cm/s LVOT VTI:          0.190 m LVOT/AV VTI ratio: 0.73  AORTA Ao Root diam: 4.10 cm MITRAL VALVE MV Area (PHT): 2.80 cm    SHUNTS MV Peak grad:  3.3 mmHg    Systemic VTI:  0.19 m MV Mean grad:  1.0 mmHg    Systemic Diam: 2.30 cm MV Vmax:  0.90 m/s MV Vmean:      53.8 cm/s MV Decel Time: 271 msec MV E velocity: 83.10 cm/s MV A velocity: 83.60 cm/s MV E/A ratio:  0.99 Dina Rich MD Electronically signed by Dina Rich MD Signature Date/Time: 10/19/2019/2:57:17 PM    Final    Disposition   Pt is being discharged home today in good condition.  Follow-up Plans & Appointments     Follow-up Information    Rinaldo Cloud, MD Follow up.   Specialty: Cardiology Why: Please arrange for follow up within 2 weeks Contact information: [address] [place] [address] [phone]              Discharge Instructions    Amb Referral to Cardiac Rehabilitation   Complete by: As directed    Diagnosis:  Coronary Stents STEMI     After initial evaluation and assessments completed: Virtual Based Care may be provided alone or in conjunction with Phase 2 Cardiac Rehab based on patient barriers.: Yes   Call MD for:  redness, tenderness, or signs of infection (pain, swelling, redness, odor or green/yellow discharge around incision site)   Complete by: As directed    Diet - low sodium heart healthy   Complete by: As directed    Discharge instructions    Complete by: As directed    Radial Site Care Refer to this sheet in the next few weeks. These instructions provide you with information on caring for yourself after your procedure. Your caregiver may also give you more specific instructions. Your treatment has been planned according to current medical practices, but problems sometimes occur. Call your caregiver if you have any problems or questions after your procedure. HOME CARE INSTRUCTIONS You may shower the day after the procedure.Remove the bandage (dressing) and gently wash the site with plain soap and water.Gently pat the site dry.  Do not apply powder or lotion to the site.  Do not submerge the affected site in water for 3 to 5 days.  Inspect the site at least twice daily.  Do not flex or bend the affected arm for 24 hours.  No lifting over 5 pounds (2.3 kg) for 5 days after your procedure.  Do not drive home if you are discharged the same day of the procedure. Have someone else drive you.  You may drive 24 hours after the procedure unless otherwise instructed by your caregiver.  What to expect: Any bruising will usually fade within 1 to 2 weeks.  Blood that collects in the tissue (hematoma) may be painful to the touch. It should usually decrease in size and tenderness within 1 to 2 weeks.  SEEK IMMEDIATE MEDICAL CARE IF: You have unusual pain at the radial site.  You have redness, warmth, swelling, or pain at the radial site.  You have drainage (other than a small amount of blood on the dressing).  You have chills.  You have a fever or persistent symptoms for more than 72 hours.  You have a fever and your symptoms suddenly get worse.  Your arm becomes pale, cool, tingly, or numb.  You have heavy bleeding from the site. Hold pressure on the site.   PLEASE DO NOT MISS ANY DOSES OF YOUR BRILINTA!!!!! Also keep a log of you blood pressures and bring back to your follow up appt. Please call the office with any questions.   Patients  taking blood thinners should generally stay away from medicines like ibuprofen, Advil, Motrin, naproxen, and Aleve due  to risk of stomach bleeding. You may take Tylenol as directed or talk to your primary doctor about alternatives.  Some studies suggest Prilosec/Omeprazole interacts with Plavix. We changed your Prilosec/Omeprazole to the equivalent dose of Protonix for less chance of interaction.   Increase activity slowly   Complete by: As directed      Discharge Medications   Allergies as of 10/21/2019   No Known Allergies     Medication List    STOP taking these medications   erythromycin ophthalmic ointment   fish oil-omega-3 fatty acids 1000 MG capsule   HYDROcodone-acetaminophen 5-325 MG tablet Commonly known as: NORCO/VICODIN   vitamin C 500 MG tablet Commonly known as: ASCORBIC ACID   zolpidem 10 MG tablet Commonly known as: AMBIEN     TAKE these medications   aspirin 81 MG EC tablet Take 1 tablet (81 mg total) by mouth daily. Swallow whole. Start taking on: October 22, 2019 What changed: additional instructions   atorvastatin 80 MG tablet Commonly known as: LIPITOR Take 1 tablet (80 mg total) by mouth daily. Start taking on: October 22, 2019 What changed:   medication strength  how much to take  when to take this   metoprolol succinate 25 MG 24 hr tablet Commonly known as: TOPROL-XL Take 25 mg by mouth every morning.   nitroGLYCERIN 0.4 MG SL tablet Commonly known as: Nitrostat Place 1 tablet (0.4 mg total) under the tongue every 5 (five) minutes as needed for chest pain.   ticagrelor 90 MG Tabs tablet Commonly known as: BRILINTA Take 1 tablet (90 mg total) by mouth 2 (two) times daily.       Aspirin prescribed at discharge?  Yes High Intensity Statin Prescribed? (Lipitor 40-80mg  or Crestor 20-40mg ): Yes Beta Blocker Prescribed? Yes For EF <40%, was ACEI/ARB Prescribed? No: consider as outpatient, blood pressures are soft. ADP Receptor  Inhibitor Prescribed? (i.e. Plavix etc.-Includes Medically Managed Patients): Yes For EF <40%, Aldosterone Inhibitor Prescribed? No: N/a Was EF assessed during THIS hospitalization? Yes Was Cardiac Rehab II ordered? (Included Medically managed Patients): Yes   Outstanding Labs/Studies   FLP/LFTs in 8 weeks.   Duration of Discharge Encounter   Greater than 30 minutes including physician time.  Signed, Laverda Page NP-C 10/21/2019, 10:41 AM  I have personally seen and examined this patient. I agree with the assessment and plan as outlined above.  Doing well this am. No chest pain. Now s/p PCI/stenting of the LAD, intermediate and RCA. Will d/c home today on ASA/Brilinta/statin and beta blocker. BP soft this am. Will not add an ARB. Consider addition of an ARB if BP stable at outpatient follow up.   [name] 10/21/2019 10:41 AM

## 2019-10-21 NOTE — TOC Benefit Eligibility Note (Signed)
Transition of Care [facility]) Benefit Eligibility Note    Patient Details  Name: Jeremy [name] MRN: [id number] Date of Birth: [birth year]   Medication/Dose: Marden Noble 90mg . bid  Covered?: Yes  Tier:  (?)  Prescription Coverage [facility]erred [facility]: [facility]  Spoke with Person/Company/Phone Number:: [phone]. [facility] Help Desk [phone]  Co-Pay: $40.00  Prior Approval: No  Deductible:  (No Deductible)       FV#[phone] Phone Number: 10/21/2019, 11:57 AM

## 2019-10-21 NOTE — Progress Notes (Signed)
CARDIAC REHAB PHASE I   Offered to walk with pt. Pt walking independently without difficulty. Pt has no complaints, anxious to go home. Reinforced importance of ASA, Brilinta, statin, and NTG. Pt given MI book along with heart healthy diet. Reviewed importance of daily weights, restrictions, site care, and exercise guidelines. Will refer to CRP II [place]. Pt is interested in participating in Virtual Cardiac and Pulmonary Rehab. Pt advised that Virtual Cardiac and Pulmonary Rehab is provided at no cost to the patient.  Checklist:  1. Pt has smart device  ie smartphone and/or ipad for downloading an app  Yes 2. Reliable internet/wifi service    Yes 3. Understands how to use their smartphone and navigate within an app.  Yes  Pt verbalized understanding and is in agreement.  [id number] Reynold Bowen, RN BSN 10/21/2019 9:42 AM

## 2019-10-30 ENCOUNTER — Telehealth (HOSPITAL_COMMUNITY): Payer: Self-pay

## 2019-10-30 NOTE — Telephone Encounter (Signed)
Referral recv'd and verified for MD signature.  Pt has not schedule his f/u appt with Dr. Sharyn Lull office. I called and spoke with Morrie Sheldon at Dr. Sharyn Lull office, who stated they have not contacted pt yet to scheduled his f/u appt. But she will give him a call today to schedule his f/u and will contact CR with the date and time.     Insurance benefits and eligibility TBD.

## 2019-10-31 NOTE — Telephone Encounter (Signed)
Attempted to call patient in regards to Cardiac Rehab - LM on VM 

## 2019-10-31 NOTE — Telephone Encounter (Signed)
Pt insurance is active and benefits verified through Benefit Adm. System. Co-pay $0.00, DED $500.00/$0.00 met, out of pocket $2,000.00/$39.97 met, co-insurance 30%. No pre-authorization required. [place]/Benefit Adm. system, 10/31/19 @ 845AM, [id number]  Will contact patient to see if he is interested in the Cardiac Rehab Program. If interested, patient will need to complete follow up appt. Once completed, patient will be contacted for scheduling upon review by the RN Navigator.

## 2019-12-05 ENCOUNTER — Telehealth (HOSPITAL_COMMUNITY): Payer: Self-pay

## 2019-12-05 NOTE — Telephone Encounter (Signed)
Called and spoke with pt in regards to CR, pt stated he will check with Dr. Sharyn Lull office and give [place] a call back.  Will place pt ppw in ready to schedule folder.

## 2020-01-27 ENCOUNTER — Telehealth (HOSPITAL_COMMUNITY): Payer: Self-pay

## 2020-01-27 ENCOUNTER — Encounter (HOSPITAL_COMMUNITY): Payer: Self-pay

## 2020-01-27 NOTE — Telephone Encounter (Signed)
Attempted to call patient in regards to Cardiac Rehab - LM on VM Mailed letter 

## 2020-01-27 NOTE — Telephone Encounter (Signed)
Pt called back and stated that he was not interested in the cardiac rehab program, advised pt that if anything changes to call back, pt understood. Closed referral.

## 2020-09-24 IMAGING — DX DG SHOULDER 2+V*R*
3 series · 3 of 3 positions shown · non-contrast
Comparison: None.

CLINICAL DATA: 59-year-old male with history of right shoulder pain

EXAM:
RIGHT SHOULDER - 2+ VIEW

[shoulder grashey]
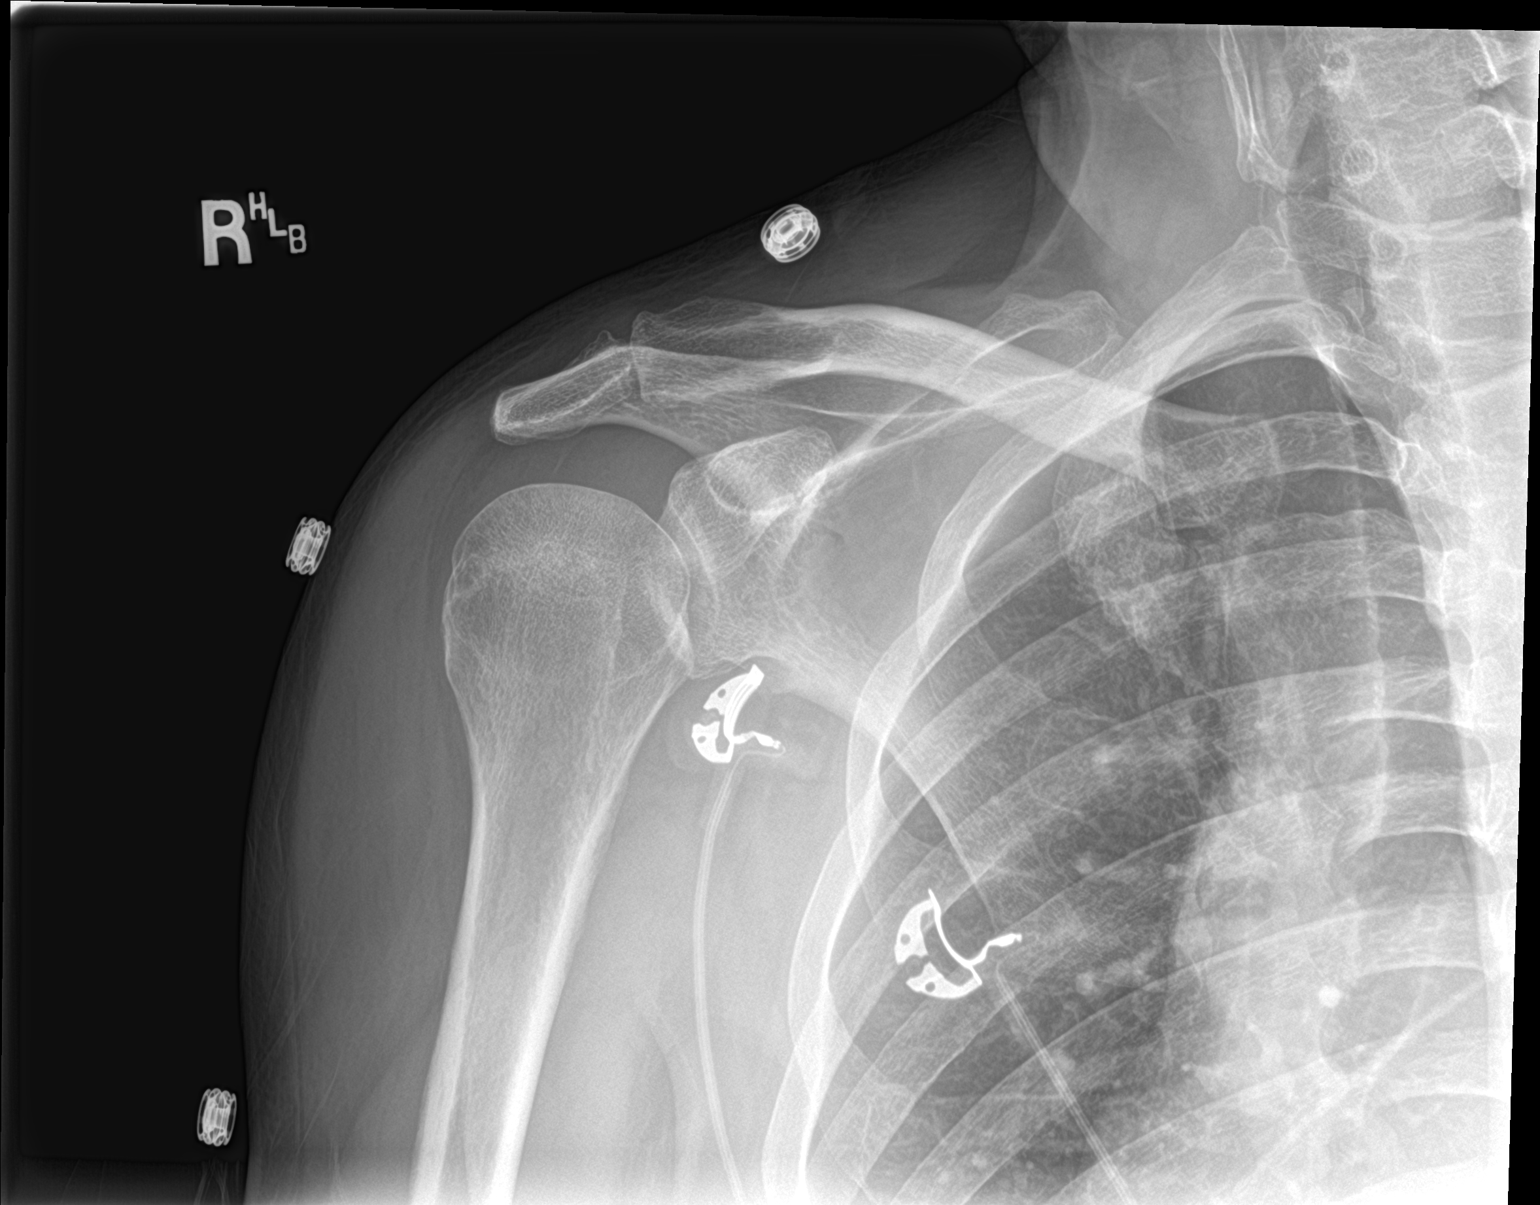

[shoulder y view]
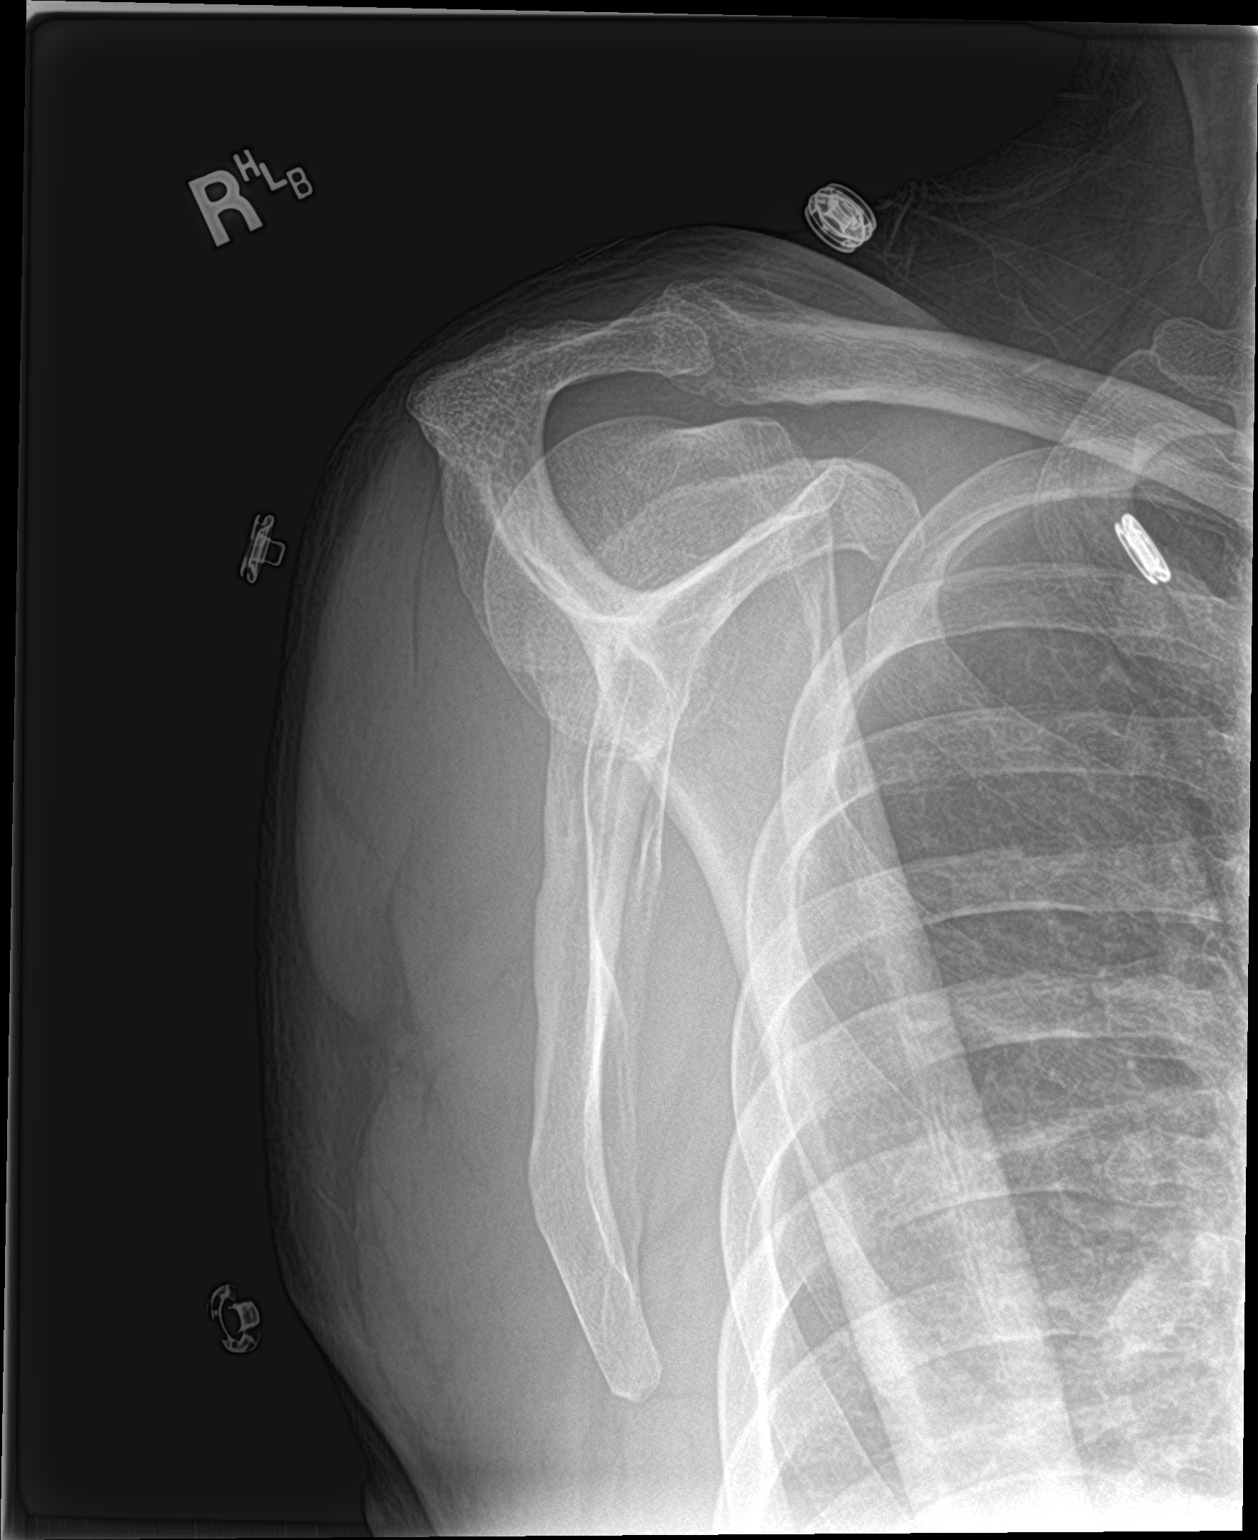

[shoulder ap neutral]
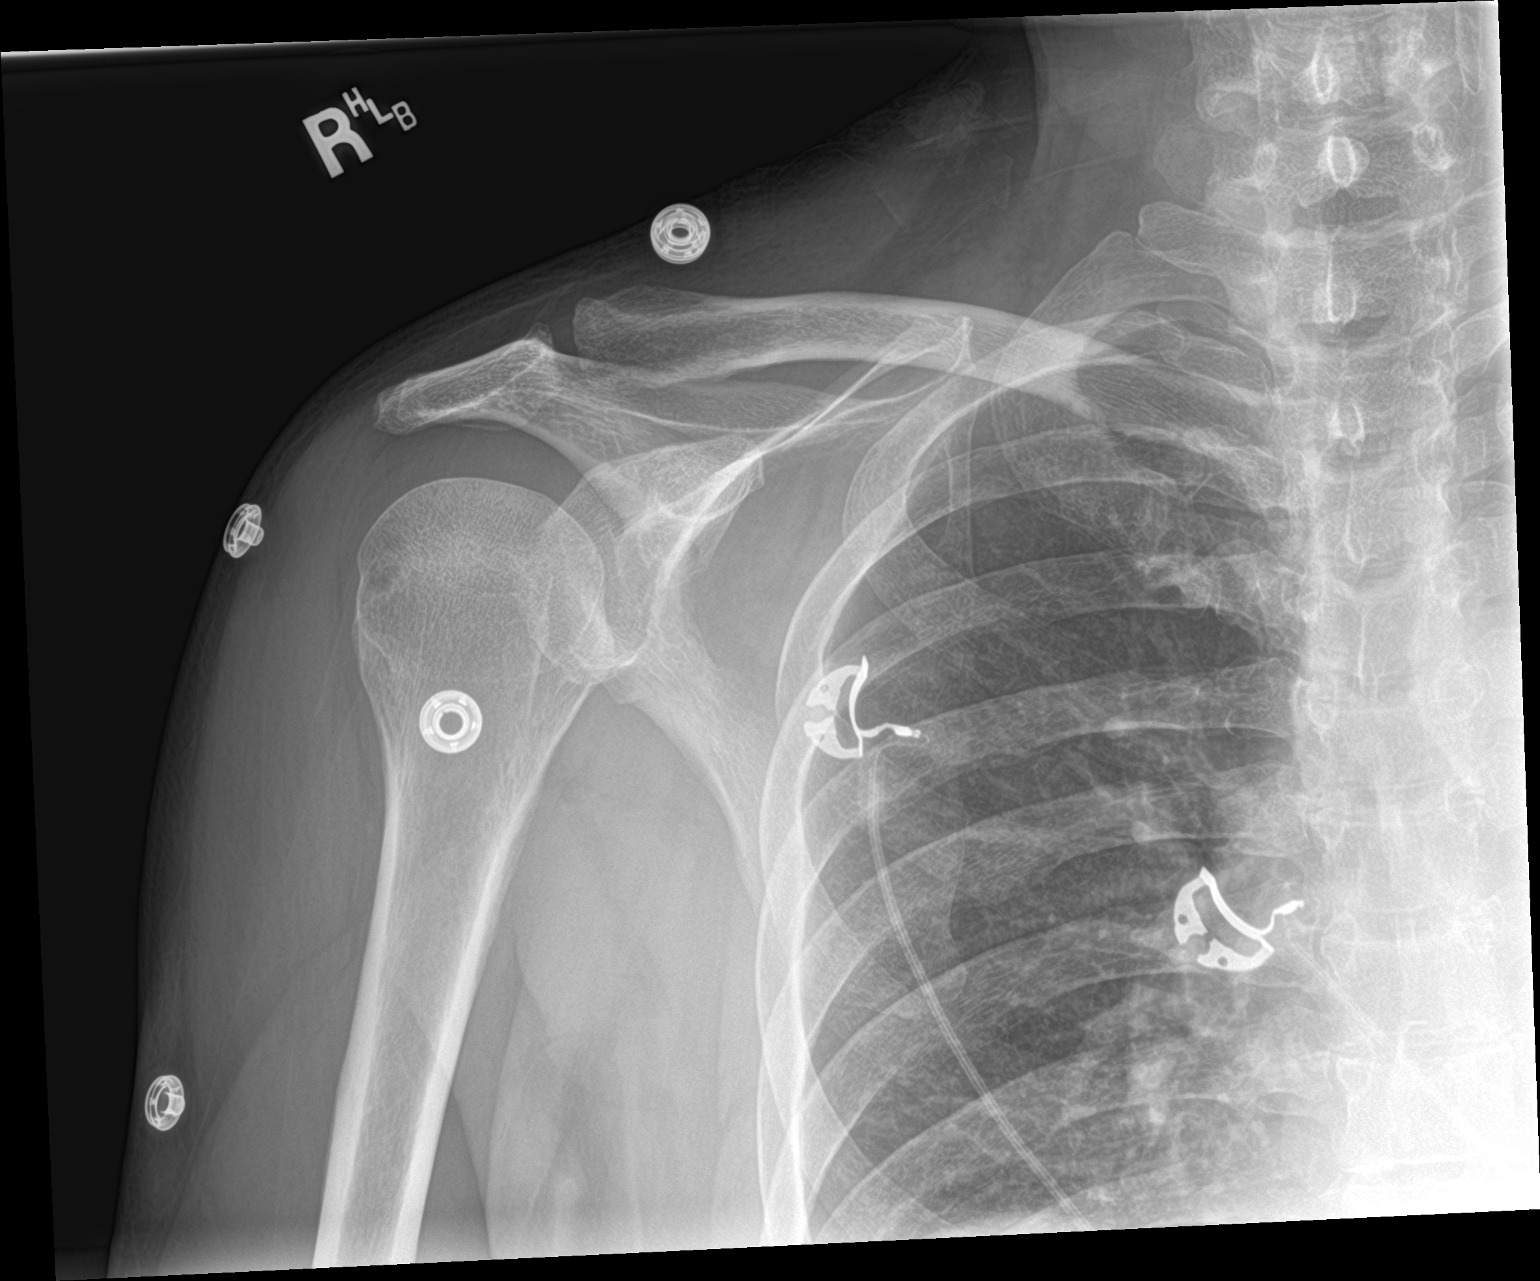

[3 of 3 positions shown; findings below may reference images not displayed]

FINDINGS: The glenohumeral joint appears congruent, however, there is
increased acromial-humeral interval, estimated 12 mm-39 mm on the AP
image. Degenerative changes of the acromioclavicular joint.

No acute displaced fracture.  No radiopaque foreign body.
IMPRESSION: No acute displaced fracture.

The acromial-humeral interval is increased which can be seen with
joint effusion or less likely [REDACTED] joint subluxation.

Degenerative changes of the acromioclavicular joint.

## 2021-06-01 ENCOUNTER — Ambulatory Visit (INDEPENDENT_AMBULATORY_CARE_PROVIDER_SITE_OTHER): Payer: No Typology Code available for payment source | Admitting: Dermatology

## 2021-06-01 DIAGNOSIS — B359 Dermatophytosis, unspecified: Secondary | ICD-10-CM

## 2021-06-01 LAB — POCT SKIN KOH: Skin KOH, POC: NEGATIVE

## 2021-06-01 MED ORDER — FLUCONAZOLE 100 MG PO TABS: 100.0000 mg | ORAL_TABLET | Freq: Every day | ORAL | 0 refills | Status: AC

## 2021-06-19 ENCOUNTER — Encounter: Payer: Self-pay | Admitting: Dermatology

## 2021-06-19 NOTE — Progress Notes (Signed)
   Follow-Up Visit   Subjective  Jeremy [name] is a 61 y.o. male who presents for the following: Skin Problem (Patient said his foot fungus is spreading. Its now in his groin area on his thigh. Has had this in the past. Treatment [facility]. ).  Spreading rash leg Location:  Duration:  Quality:  Associated Signs/Symptoms: Modifying Factors:  Severity:  Timing: Context:   Objective  Well appearing patient in no apparent distress; mood and affect are within normal limits. Right Thigh - Anterior Non marginated patchy chronic dermatitis, KOH negative.  History of toenail tinea.    A focused examination was performed including hands, legs, nails. Relevant physical exam findings are noted in the Assessment and Plan.   Assessment & Plan    Tinea Right Thigh - Anterior  Culture.  Fluconazole 100 mg daily for 2 weeks.  Follow-up by MyChart or phone in 4 weeks.  All potential risks detailed.  fluconazole (DIFLUCAN) 100 MG tablet - Right Thigh - Anterior Take 1 tablet (100 mg total) by mouth daily for 14 days.  POCT Skin KOH - Right Thigh - Anterior  Culture, fungus without smear - Right Thigh - Anterior      I, Janalyn Harder, MD, have reviewed all documentation for this visit.  The documentation on 06/19/21 for the exam, diagnosis, procedures, and orders are all accurate and complete.

## 2021-07-01 LAB — CULTURE, FUNGUS WITHOUT SMEAR
CULTURE:: NO GROWTH
MICRO NUMBER:: 13345938
SPECIMEN QUALITY:: ADEQUATE

## 2021-07-29 ENCOUNTER — Encounter (HOSPITAL_COMMUNITY): Payer: Self-pay

## 2021-07-29 ENCOUNTER — Emergency Department (HOSPITAL_COMMUNITY)
Admission: EM | Admit: 2021-07-29 | Discharge: 2021-07-29 | Discharge status: Against Medical Advice | Payer: No Typology Code available for payment source | Attending: Emergency Medicine | Admitting: Emergency Medicine

## 2021-07-29 DIAGNOSIS — R109 Unspecified abdominal pain: Secondary | ICD-10-CM | POA: Diagnosis not present

## 2021-07-29 DIAGNOSIS — Z87442 Personal history of urinary calculi: Secondary | ICD-10-CM | POA: Diagnosis not present

## 2021-07-29 DIAGNOSIS — Z5321 Procedure and treatment not carried out due to patient leaving prior to being seen by health care provider: Secondary | ICD-10-CM | POA: Insufficient documentation

## 2021-07-29 DIAGNOSIS — R319 Hematuria, unspecified: Secondary | ICD-10-CM | POA: Insufficient documentation

## 2021-07-29 DIAGNOSIS — Z7982 Long term (current) use of aspirin: Secondary | ICD-10-CM | POA: Insufficient documentation

## 2021-07-29 LAB — URINALYSIS, ROUTINE W REFLEX MICROSCOPIC
Bilirubin Urine: NEGATIVE
Glucose, UA: NEGATIVE mg/dL
Ketones, ur: NEGATIVE mg/dL
Leukocytes,Ua: NEGATIVE
Nitrite: NEGATIVE
Protein, ur: 30 mg/dL — AB
RBC / HPF: >50 RBC/hpf — ABNORMAL HIGH (ref 0–5)
Specific Gravity, Urine: 1.009 (ref 1.005–1.030)
pH: 5 (ref 5.0–8.0)

## 2021-07-29 LAB — CBC
HCT: 41.6 % (ref 39.0–52.0)
Hemoglobin: 14.5 g/dL (ref 13.0–17.0)
MCH: 30.7 pg (ref 26.0–34.0)
MCHC: 34.9 g/dL (ref 30.0–36.0)
MCV: 87.9 fL (ref 80.0–100.0)
Platelets: 304 10*3/uL (ref 150–400)
RBC: 4.73 MIL/uL (ref 4.22–5.81)
RDW: 12 % (ref 11.5–15.5)
WBC: 6.2 10*3/uL (ref 4.0–10.5)
nRBC: 0 % (ref 0.0–0.2)

## 2021-07-29 LAB — BASIC METABOLIC PANEL
Anion gap: 9 (ref 5–15)
BUN: 12 mg/dL (ref 6–20)
CO2: 23 mmol/L (ref 22–32)
Calcium: 9 mg/dL (ref 8.9–10.3)
Chloride: 108 mmol/L (ref 98–111)
Creatinine, Ser: 0.82 mg/dL (ref 0.61–1.24)
GFR, Estimated: >60 mL/min (ref >60)
Glucose, Bld: 97 mg/dL (ref 70–99)
Potassium: 4 mmol/L (ref 3.5–5.1)
Sodium: 140 mmol/L (ref 135–145)

## 2021-07-29 NOTE — ED Notes (Signed)
Patient left without being seen, states "I have to go to work at 2:00 and I can't stay any longer."

## 2021-07-29 NOTE — ED Provider Triage Note (Signed)
Emergency Medicine Provider Triage Evaluation Note  Jeremy [name] , a 61 y.o. male  was evaluated in triage.  Pt complains of blood-tinged urine.  Patient had think urine around 2 weeks ago that spontaneously resolved.  He reports that he had brown with dark red urine x1 yesterday that seemed to resolve in the pink urine.  He reports on Wednesday he had 1 episode of sharp right lower quadrant pain, no pain today.  He does endorse a remote history of kidney stones, has not seen his urologist recently as he retired last year.  He denies any fever, chills.  Review of Systems  Positive: Hematuria, abdominal pain Negative: Fever, chills  Physical Exam  BP 131/90 (BP Location: Right Arm)   Pulse 77   Temp 98.7 F (37.1 C) (Oral)   Resp 16   Ht 5\' 8"  (1.727 m)   Wt 75 kg   SpO2 96%   BMI 25.14 kg/m  Gen:   Awake, no distress   Resp:  Normal effort  MSK:   Moves extremities without difficulty  Other:  No TTP abdomen  Medical Decision Making  Medically screening exam initiated at 2:43 PM.  Appropriate orders placed.  Jeremy [name] was informed that the remainder of the evaluation will be completed by another provider, this initial triage assessment does not replace that evaluation, and the importance of remaining in the ED until their evaluation is complete.  Workup initiated   Clovis Fredrickson, Olene Floss 07/29/21 1444

## 2021-07-29 NOTE — ED Triage Notes (Signed)
Pt arrives POV for eval of hematuria onset yesterday. Reports started w/ some RLQ abd pain, noted brown urine yesterday morning which turned pink tinged by the end of the day. Denies dysuria, foul smelling urine

## 2021-07-30 ENCOUNTER — Other Ambulatory Visit: Payer: Self-pay

## 2021-07-30 ENCOUNTER — Encounter (HOSPITAL_BASED_OUTPATIENT_CLINIC_OR_DEPARTMENT_OTHER): Payer: Self-pay

## 2021-07-30 ENCOUNTER — Emergency Department (HOSPITAL_BASED_OUTPATIENT_CLINIC_OR_DEPARTMENT_OTHER): Payer: No Typology Code available for payment source

## 2021-07-30 ENCOUNTER — Emergency Department (HOSPITAL_BASED_OUTPATIENT_CLINIC_OR_DEPARTMENT_OTHER)
Admission: EM | Admit: 2021-07-30 | Discharge: 2021-07-30 | Disposition: A | Discharge status: Home / Self Care | Payer: No Typology Code available for payment source | Attending: Emergency Medicine | Admitting: Emergency Medicine

## 2021-07-30 DIAGNOSIS — Z79899 Other long term (current) drug therapy: Secondary | ICD-10-CM | POA: Insufficient documentation

## 2021-07-30 DIAGNOSIS — I10 Essential (primary) hypertension: Secondary | ICD-10-CM | POA: Insufficient documentation

## 2021-07-30 DIAGNOSIS — K5730 Diverticulosis of large intestine without perforation or abscess without bleeding: Secondary | ICD-10-CM | POA: Diagnosis not present

## 2021-07-30 DIAGNOSIS — I2510 Atherosclerotic heart disease of native coronary artery without angina pectoris: Secondary | ICD-10-CM | POA: Insufficient documentation

## 2021-07-30 DIAGNOSIS — N200 Calculus of kidney: Secondary | ICD-10-CM | POA: Insufficient documentation

## 2021-07-30 DIAGNOSIS — Z7982 Long term (current) use of aspirin: Secondary | ICD-10-CM | POA: Insufficient documentation

## 2021-07-30 DIAGNOSIS — R319 Hematuria, unspecified: Secondary | ICD-10-CM

## 2021-07-30 LAB — URINE CULTURE: Culture: NO GROWTH

## 2021-07-30 NOTE — ED Triage Notes (Signed)
Patient here POV from Home with Hematuria.  Patient endorses Blood in Urine that began Wednesday. Initially [employer] and then Pink and then began again today this AM.   No Clots. No Fevers. No N/V/D.   Seen in ED last PM but LWBS due to Wait.   NAD Noted during Triage. A&Ox4. GCS 15. Ambulatory.

## 2021-07-30 NOTE — ED Provider Notes (Signed)
[facility] EMERGENCY DEPARTMENT Provider Note   CSN: [id number] Arrival date & time: 07/29/21  1421     History  Chief Complaint  Patient presents with   Hematuria    Jeremy [name] is a 61 y.o. male.  Patient presents chief complaint of hematuria.  He noticed it about 2 weeks ago initially that resolved and then recurred again yesterday.  He had some sharp pain in the right lower quadrant that was intermittent.  Currently denies any pain.  Has a history of kidney stones in the past.  No reports of any fevers or cough or vomiting or diarrhea.       Home Medications Prior to Admission medications   Medication Sig Start Date End Date Taking? Authorizing Provider  aspirin EC 81 MG EC tablet Take 1 tablet (81 mg total) by mouth daily. Swallow whole. 10/22/19   Arty Baumgartner, NP  atorvastatin (LIPITOR) 80 MG tablet Take 1 tablet (80 mg total) by mouth daily. 10/22/19   Arty Baumgartner, NP  metoprolol succinate (TOPROL-XL) 25 MG 24 hr tablet Take 25 mg by mouth every morning.    [provider]  nitroGLYCERIN (NITROSTAT) 0.4 MG SL tablet Place 1 tablet (0.4 mg total) under the tongue every 5 (five) minutes as needed for chest pain. 10/21/19 10/20/20  Arty Baumgartner, NP  ticagrelor (BRILINTA) 90 MG TABS tablet Take 1 tablet (90 mg total) by mouth 2 (two) times daily. 10/21/19   Arty Baumgartner, NP      Allergies    Patient has no known allergies.    Review of Systems   Review of Systems  Constitutional:  Negative for fever.  HENT:  Negative for ear pain and sore throat.   Eyes:  Negative for pain.  Respiratory:  Negative for cough.   Cardiovascular:  Negative for chest pain.  Gastrointestinal:  Positive for abdominal pain.  Genitourinary:  Negative for flank pain.  Musculoskeletal:  Negative for back pain.  Skin:  Negative for color change and rash.  Neurological:  Negative for syncope.  All other systems reviewed and are  negative.   Physical Exam Updated Vital Signs BP 137/72 (BP Location: Right Arm)   Pulse 66   Temp 98.7 F (37.1 C) (Oral)   Resp 16   Ht 5\' 8"  (1.727 m)   Wt 75 kg   SpO2 99%   BMI 25.14 kg/m  Physical Exam Constitutional:      Appearance: He is well-developed.  HENT:     Head: Normocephalic.     Nose: Nose normal.  Eyes:     Extraocular Movements: Extraocular movements intact.  Cardiovascular:     Rate and Rhythm: Normal rate.  Pulmonary:     Effort: Pulmonary effort is normal.  Abdominal:     Tenderness: There is no abdominal tenderness. There is no right CVA tenderness, left CVA tenderness, guarding or rebound.  Skin:    Coloration: Skin is not jaundiced.  Neurological:     Mental Status: He is alert. Mental status is at baseline.     ED Results / Procedures / Treatments   Labs (all labs ordered are listed, but only abnormal results are displayed) Labs Reviewed  URINALYSIS, ROUTINE W REFLEX MICROSCOPIC - Abnormal; Notable for the following components:      Result Value   APPearance HAZY (*)    Hgb urine dipstick LARGE (*)    Protein, ur 30 (*)    RBC / HPF >50 (*)  Bacteria, UA RARE (*)    All other components within normal limits  URINE CULTURE  BASIC METABOLIC PANEL  CBC    EKG None  Radiology No results found.  Procedures Procedures    Medications Ordered in ED Medications - No data to display  ED Course/ Medical Decision Making/ A&P                           Medical Decision Making Amount and/or Complexity of Data Reviewed Labs: ordered.   Chart review shows office visit October 22, 2019 for impingement syndrome of the shoulder.  Comorbidities influencing complexity include history of prior kidney stones.  Work-up included labs White count normal hemoglobin normal chemistry unremarkable urinalysis showing negative nitrates negative leukocytes.  I went to see the patient in weight agreed to do some additional studies.  However  patient had eloped prior to completion of the studies.        Final Clinical Impression(s) / ED Diagnoses Final diagnoses:  Hematuria, unspecified type  Abdominal pain, unspecified abdominal location    Rx / DC Orders ED Discharge Orders     None         Cheryll Cockayne, MD 07/30/21 [phone]

## 2021-07-30 NOTE — ED Notes (Signed)
Dc instructions reviewed with patient. Patient voiced understanding. Dc with belongings.  °

## 2021-07-30 NOTE — Discharge Instructions (Signed)
You were seen today for blood in your urine.  Your CT scan does reveal a stone within the right kidney but this does not appear to be the cause of your bloody urine.  At this time I recommend she follow-up with urology.  I provided the contact information for the on-call urologist.  Please call and schedule an appointment.  If you are unable to urinate or your condition changes significantly, please return to the emergency room for further evaluation

## 2021-07-30 NOTE — ED Provider Notes (Signed)
[facility] EMERGENCY DEPT Provider Note   CSN: [id number] Arrival date & time: 07/30/21  1306     History  Chief Complaint  Patient presents with   Hematuria    Jeremy [name] is a 61 y.o. male.  Patient presents to the hospital with a chief complaint of hematuria.  Patient endorses not noticing hematuria on Wednesday.  Patient states urine was initially brown and then became pink, becoming darker again today.  Patient states that on Wednesday he had some right-sided abdominal pain which was moderate in severity.  Patient states he has a history of kidney stones but feels that this pain was different.  At this time patient has no abdominal pain, no back or flank pain, no dysuria, no nausea, no vomiting.  Patient's sole complaint this morning is hematuria.  Patient was seen at [facility] last night for the same problem but left prior to treatment due to wait times.  Labs were collected at that visit.  Patient has past medical history significant for right ureteral stone, CAD, hypertension, hyperlipidemia, history of cocaine abuse, history of acute MI  HPI     Home Medications Prior to Admission medications   Medication Sig Start Date End Date Taking? Authorizing Provider  aspirin EC 81 MG EC tablet Take 1 tablet (81 mg total) by mouth daily. Swallow whole. 10/22/19   Arty Baumgartner, NP  atorvastatin (LIPITOR) 80 MG tablet Take 1 tablet (80 mg total) by mouth daily. 10/22/19   Arty Baumgartner, NP  metoprolol succinate (TOPROL-XL) 25 MG 24 hr tablet Take 25 mg by mouth every morning.    [provider]  nitroGLYCERIN (NITROSTAT) 0.4 MG SL tablet Place 1 tablet (0.4 mg total) under the tongue every 5 (five) minutes as needed for chest pain. 10/21/19 10/20/20  Arty Baumgartner, NP  ticagrelor (BRILINTA) 90 MG TABS tablet Take 1 tablet (90 mg total) by mouth 2 (two) times daily. 10/21/19   Arty Baumgartner, NP      Allergies    Patient has no known allergies.     Review of Systems   Review of Systems  Constitutional:  Negative for chills and fever.  Respiratory:  Negative for shortness of breath.   Cardiovascular:  Negative for chest pain.  Gastrointestinal:  Negative for abdominal pain, blood in stool, diarrhea, nausea and vomiting.  Genitourinary:  Positive for hematuria. Negative for difficulty urinating, dysuria and urgency.    Physical Exam Updated Vital Signs BP 123/74 (BP Location: Left Arm)   Pulse 67   Temp 98.3 F (36.8 C) (Oral)   Resp 16   Ht 5\' 8"  (1.727 m)   Wt 75 kg   SpO2 97%   BMI 25.14 kg/m  Physical Exam Vitals and nursing note reviewed.  Constitutional:      General: He is not in acute distress. HENT:     Head: Normocephalic and atraumatic.  Eyes:     Conjunctiva/sclera: Conjunctivae normal.  Cardiovascular:     Rate and Rhythm: Normal rate and regular rhythm.     Pulses: Normal pulses.     Heart sounds: Normal heart sounds.  Pulmonary:     Effort: Pulmonary effort is normal.     Breath sounds: Normal breath sounds.  Musculoskeletal:     Cervical back: Normal range of motion and neck supple.  Neurological:     Mental Status: He is alert.     ED Results / Procedures / Treatments   Labs (all labs  ordered are listed, but only abnormal results are displayed) Labs Reviewed - No data to display  EKG None  Radiology CT RENAL STONE STUDY  Result Date: 07/30/2021 CLINICAL DATA:  Flank pain, hematuria EXAM: CT ABDOMEN AND PELVIS WITHOUT CONTRAST TECHNIQUE: Multidetector CT imaging of the abdomen and pelvis was performed following the standard protocol without IV contrast. RADIATION DOSE REDUCTION: This exam was performed according to the departmental dose-optimization program which includes automated exposure control, adjustment of the mA and/or kV according to patient size and/or use of iterative reconstruction technique. COMPARISON:  09/24/2011 FINDINGS: Lower chest: Included lung bases are clear. Heart  size is normal. Coronary artery atherosclerosis. Hepatobiliary: Unremarkable unenhanced appearance of the liver. No focal liver lesion identified. Gallbladder within normal limits. No hyperdense gallstone. No biliary dilatation. Pancreas: Unremarkable. No pancreatic ductal dilatation or surrounding inflammatory changes. Spleen: Normal in size without focal abnormality. Adrenals/Urinary Tract: 12 mm right adrenal gland nodule with internal density 10 HU, stable from 2013. 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 Left adrenal gland is unremarkable. 11 mm stone within the right renal pelvis without hydronephrosis. There are 2 simple cysts at the lower pole of the right kidney which do not require follow-up. Left kidney is unremarkable. No left-sided stone or hydronephrosis. Ureters and urinary bladder within normal limits. Stomach/Bowel: Stomach is within normal limits. Appendix appears normal (series 2, image 62). Scattered diverticulosis. No evidence of bowel wall thickening, distention, or inflammatory changes. Vascular/Lymphatic: Scattered aortoiliac atherosclerotic calcifications without aneurysm. No abdominopelvic lymphadenopathy. Reproductive: Mildly enlarged prostate with dystrophic calcifications. Other: No free fluid. No abdominopelvic fluid collection. No pneumoperitoneum. Tiny fat containing umbilical hernia. Musculoskeletal: No acute or significant osseous findings. Multilevel lumbar spondylosis. IMPRESSION: 1. Single 11 mm stone within the right renal pelvis without hydronephrosis. 2. Colonic diverticulosis without evidence of acute diverticulitis. 3. Aortic and coronary artery atherosclerosis (ICD10-I70.0). 4. Prostatomegaly. Electronically Signed   By: Duanne Guess D.O.   On: 07/30/2021 14:06    Procedures Procedures    Medications Ordered in ED Medications - No data to display  ED Course/ Medical Decision Making/ A&P                            Medical Decision Making Amount and/or Complexity of Data Reviewed Radiology: ordered.   Patient presents with chief complaint of hematuria.  Differential includes nephrolithiasis, pyelonephrosis, cystitis, trauma, bladder cancer, and others  I ordered and personally interpreted imaging including a CT renal study.  Study shows a single 11 mm stone within the right renal pelvis without hydronephrosis.  Colonic diverticulosis without evidence of acute diverticulitis.  Aortic and coronary artery atherosclerosis.  Prostamegaly.  I agree with the radiologist findings.  The patient had basic labs collected yesterday prior to his leaving.  I reviewed results.  Grossly normal CBC and CMP.  Urine with large hemoglobin on the dipstick and 30 protein with rare bacteria.  CT did not reveal the cause for the patient's hematuria.  Low clinical suspicion at this time for hematuria due to nephrolithiasis, pyelonephrosis, cystitis.  Patient has no history concerning for trauma to the urethra.  Bladder cancer is still in the differential.  At this time I believe the patient may discharge home and follow-up with urology.  Patient given return precautions including urinary retention.  Patient voices understanding.        Final Clinical Impression(s) / ED Diagnoses Final diagnoses:  Hematuria, unspecified type  Rx / DC Orders ED Discharge Orders     None         Pamala Duffel 07/30/21 1443    Rolan Bucco, MD 07/30/21 1530

## 2021-08-04 ENCOUNTER — Encounter: Payer: Self-pay | Admitting: Urology

## 2021-08-04 ENCOUNTER — Other Ambulatory Visit: Payer: Self-pay | Admitting: Urology

## 2021-08-04 ENCOUNTER — Ambulatory Visit (INDEPENDENT_AMBULATORY_CARE_PROVIDER_SITE_OTHER): Payer: Self-pay | Admitting: Urology

## 2021-08-04 VITALS — BP 116/72 | HR 65 | Ht 68.5 in | Wt 165.0 lb

## 2021-08-04 DIAGNOSIS — N201 Calculus of ureter: Secondary | ICD-10-CM

## 2021-08-04 DIAGNOSIS — R310 Gross hematuria: Secondary | ICD-10-CM

## 2021-08-04 DIAGNOSIS — N200 Calculus of kidney: Secondary | ICD-10-CM

## 2021-08-04 NOTE — Progress Notes (Signed)
Surgical Physician Order Form [facility]  * Scheduling expectation :  08/12/2021  *Length of Case: 1.5 hours  *Clearance needed: yes, cardiology  *Anticoagulation Instructions: Hold all anticoagulants (okay to continue baby aspirin)  *Aspirin Instructions: Ok to continue Aspirin baby  *Post-op visit Date/Instructions:   TBD  *Diagnosis: Right UPJ Stone, gross hematuria  *Procedure: Cystoscopy, bilateral retrograde pyelograms, right ureteroscopy/laser lithotripsy/stent placement, possible bladder biopsy and fulguration   Additional orders: N/A  -Admit type: OUTpatient  -Anesthesia: General  -VTE Prophylaxis Standing Order SCD's       Other:   -Standing Lab Orders Per Anesthesia    Lab other: UA&Urine Culture (sent 7/6)  -Standing Test orders EKG/Chest x-ray per Anesthesia       Test other:   - Medications:  Ancef 2gm IV  -Other orders:  N/A

## 2021-08-04 NOTE — H&P (View-Only) (Signed)
08/04/21 4:24 PM   Jeremy [name] [birth year] [id number]  CC: Gross hematuria, right-sided flank pain, nephrolithiasis  HPI: 61 year old male with extensive cardiac history on dual antiplatelet therapy with most recent MI in September 2021.  He denies any recent chest pain or shortness of breath.  He has a distant history of cocaine abuse, but reports this was over 15 years ago.  He recently presented to the ER on 07/29/2021 with gross hematuria and some intermittent right-sided flank pain, and again on 07/30/2021.  A CT without contrast was performed showing a 1 cm right renal pelvis stone, but no other acute abnormalities.  Urinalysis at that time showed microscopic hematuria and culture was negative.  He denies any dysuria or urinary symptoms, no fevers or chills.  Denies any smoking history.  He has been holding his Brilinta, and gross hematuria has resolved.  He denies any flank pain today.  He has a history of a kidney stone on the right side in 2013 treated with ureteroscopy by Dr. Vernie Ammons, and it sounds like he tolerated his stent well at that time.  Urinalysis today with 6-10 WBCs, greater than 30 RBCs, moderate bacteria, nitrite negative, trace leukocytes.  Will send for culture.   PMH: Past Medical History:  Diagnosis Date   Coronary artery disease CARDIOLOGIST-  DR HARWANI-- LOV  MAY 2013   (11-08-2011 DENIES CARDIAC SYMPTOMS)   History of acute myocardial infarction 12-21-2006--  ACUTE INFEROPOSTERIOR WALL MI   S/P PTCA W/ DE STENTS TO RCA    History of cocaine abuse (HCC)    Hyperlipidemia    Hypertension    Right ureteral stone     Surgical History: Past Surgical History:  Procedure Laterality Date   CARDIOVASCULAR STRESS TEST  04-26-2007  DR HARWANI   NORMAL STUDY/ NO ISCHEMIA   CORONARY ANGIOPLASTY WITH STENT PLACEMENT  12-18-2007  DR HARWANI   PTCA TO PROXIMAL RCA AND DRUG-ELUTING STENT IN PROXIMAL AND MID RCA   CORONARY STENT INTERVENTION N/A 10/20/2019    Procedure: CORONARY STENT INTERVENTION;  Surgeon: Kathleene Hazel, MD;  Location: [facility] INVASIVE CV LAB;  Service: Cardiovascular;  Laterality: N/A;   CORONARY/GRAFT ACUTE MI REVASCULARIZATION N/A 10/18/2019   Procedure: Coronary/Graft Acute MI Revascularization;  Surgeon: Kathleene Hazel, MD;  Location: [facility] INVASIVE CV LAB;  Service: Cardiovascular;  Laterality: N/A;   INGUINAL HERNIA REPAIR     BILATERAL   LEFT HEART CATH AND CORONARY ANGIOGRAPHY N/A 10/18/2019   Procedure: LEFT HEART CATH AND CORONARY ANGIOGRAPHY;  Surgeon: Kathleene Hazel, MD;  Location: [facility] INVASIVE CV LAB;  Service: Cardiovascular;  Laterality: N/A;   URETEROSCOPY  11/13/2011   Procedure: URETEROSCOPY;  Surgeon: Garnett Farm, MD;  Location: [facility];  Service: Urology;  Laterality: Right;  1 hour requested for this case  C-ARM [facility]    Family History: No family history on file.  Social History:  reports that he quit smoking about 14 years ago. His smoking use included cigarettes. He has a 15.00 pack-year smoking history. He has quit using smokeless tobacco. He reports current alcohol use. He reports that he does not currently use drugs.  Physical Exam: BP 116/72 (BP Location: Left Arm, Patient Position: Sitting, Cuff Size: Normal)   Pulse 65   Ht 5' 8.5" (1.74 m)   Wt 165 lb (74.8 kg)   BMI 24.72 kg/m    Constitutional:  Alert and oriented, No acute distress. Cardiovascular: No clubbing, cyanosis, or edema. Respiratory: Normal respiratory effort,  no increased work of breathing. GI: Abdomen is soft, nontender, nondistended, no abdominal masses   Laboratory Data: Reviewed, see HPI  Pertinent Imaging: I have personally viewed and interpreted the CT showing a 1 cm right renal pelvis stone.  Assessment & Plan:   60 year old male with extensive cardiac history on dual antiplatelet therapy who presents with gross hematuria as well as some intermittent right-sided flank  pain.  CT shows a 1 cm right renal pelvis stone that is the likely cause of his flank pain and likely his gross hematuria as well.  We discussed that cannot rule out a bladder lesion without cystoscopy.  We discussed options including cystoscopy in clinic with follow-up for shockwave lithotripsy of the right-sided stone, versus cystoscopy, right ureteroscopy, laser lithotripsy, and stent placement, with possible biopsy or TURBT of any abnormal bladder lesions at that time.  Would also perform bilateral retrograde pyelograms to complete hematuria work-up.    Using shared decision making he opted to pursue OR for cystoscopy, bilateral retrograde pyelograms, right ureteroscopy, laser lithotripsy, stent placement, possible biopsy/fulguration/TURBT of any suspicious bladder lesions.  Will need cardiac clearance, will need to hold Brilinta, but okay to continue baby aspirin with his extensive cardiac history and stents  Legrand Rams, MD 08/04/2021  [facility] [address], [address] [place], [place] [address] [phone]

## 2021-08-04 NOTE — Patient Instructions (Signed)
Laser Therapy for Kidney Stones Laser therapy for kidney stones is a procedure to break up small, hard mineral deposits that form in the kidney (kidney stones). The procedure is done using a device that produces a focused beam of light (laser). The laser breaks up kidney stones into pieces that are small enough to be passed out of the body through urination or removed from the body during the procedure. You may need laser therapy if you have kidney stones that are painful or block your urinary tract. This procedure is done by inserting a tube (ureteroscope) into your kidney through the urethral opening. The urethra is the part of the body that drains urine from the bladder. In women, the urethra opens above the vaginal opening. In men, the urethra opens at the tip of the penis. The ureteroscope is inserted through the urethra, and surgical instruments are moved through the bladder and the muscular tube that connects the kidney to the bladder (ureter) until they reach the kidney. Tell a health care provider about: Any allergies you have. All medicines you are taking, including vitamins, herbs, eye drops, creams, and over-the-counter medicines. Any problems you or family members have had with anesthetic medicines. Any blood disorders you have. Any surgeries you have had. Any medical conditions you have. Whether you are pregnant or may be pregnant. What are the risks? Generally, this is a safe procedure. However, problems may occur, including: Infection. Bleeding. Allergic reactions to medicines. Damage to the urethra, bladder, or ureter. Urinary tract infection (UTI). Narrowing of the urethra (urethral stricture). Difficulty passing urine. Blockage of the kidney caused by a fragment of kidney stone. What happens before the procedure? Medicines Ask your health care provider about: Changing or stopping your regular medicines. This is especially important if you are taking diabetes medicines or  blood thinners. Taking medicines such as aspirin and ibuprofen. These medicines can thin your blood. Do not take these medicines unless your health care provider tells you to take them. Taking over-the-counter medicines, vitamins, herbs, and supplements. Eating and drinking Follow instructions from your health care provider about eating and drinking, which may include: 8 hours before the procedure - stop eating heavy meals or foods, such as meat, fried foods, or fatty foods. 6 hours before the procedure - stop eating light meals or foods, such as toast or cereal. 6 hours before the procedure - stop drinking milk or drinks that contain milk. 2 hours before the procedure - stop drinking clear liquids. Staying hydrated Follow instructions from your health care provider about hydration, which may include: Up to 2 hours before the procedure - you may continue to drink clear liquids, such as water, clear fruit juice, black coffee, and plain tea.  General instructions You may have a physical exam before the procedure. You may also have tests, such as imaging tests and blood or urine tests. If your ureter is too narrow, your health care provider may place a soft, flexible tube (stent) inside of it. The stent may be placed days or weeks before your laser therapy procedure. Plan to have someone take you home from the hospital or clinic. If you will be going home right after the procedure, plan to have someone stay with you for 24 hours. Do not use any products that contain nicotine or tobacco for at least 4 weeks before the procedure. These products include cigarettes, e-cigarettes, and chewing tobacco. If you need help quitting, ask your health care provider. Ask your health care provider: How your   surgical site will be marked or identified. What steps will be taken to help prevent infection. These may include: Removing hair at the surgery site. Washing skin with a germ-killing soap. Taking antibiotic  medicine. What happens during the procedure?  An IV will be inserted into one of your veins. You will be given one or more of the following: A medicine to help you relax (sedative). A medicine to numb the area (local anesthetic). A medicine to make you fall asleep (general anesthetic). A ureteroscope will be inserted into your urethra. The ureteroscope will send images to a video screen in the operating room to guide your surgeon to the area of your kidney that will be treated. A small, flexible tube will be threaded through the ureteroscope and into your bladder and ureter, up to your kidney. The laser device will be inserted into your kidney through the tube. Your surgeon will pulse the laser on and off to break up kidney stones. A surgical instrument that has a tiny wire basket may be inserted through the tube into your kidney to remove the pieces of broken kidney stone. The procedure may vary among health care providers and hospitals. What happens after the procedure? Your blood pressure, heart rate, breathing rate, and blood oxygen level will be monitored until you leave the hospital or clinic. You will be given pain medicine as needed. You may continue to receive antibiotics. You may have a stent temporarily placed in your ureter. Do not drive for 24 hours if you were given a sedative during your procedure. You may be given a strainer to collect any stone fragments that you pass in your urine. Your health care provider may have these tested. Summary Laser therapy for kidney stones is a procedure to break up kidney stones into pieces that are small enough to be passed out of the body through urination or removed during the procedure. Follow instructions from your health care provider about eating and drinking before the procedure. During the procedure, the ureteroscope will send images to a video screen to guide your surgeon to the area of your kidney that will be treated. Do not drive  for 24 hours if you were given a sedative during your procedure. This information is not intended to replace advice given to you by your health care provider. Make sure you discuss any questions you have with your health care provider. Document Revised: 09/20/2020 Document Reviewed: 09/20/2020 Elsevier Patient Education  2023 Elsevier Inc.  Bladder Biopsy A bladder biopsy is a procedure to remove a small sample of tissue from the bladder. The tissue is checked under a microscope to diagnose or rule out bladder cancer. During a bladder biopsy, your health care provider may guide a long, thin instrument with a lighted camera (cystoscope) through your urethra and into your bladder. This lets your health care provider see the lining of your urethra and bladder and take a tissue sample. If your ureters need to be checked, a longer tube (ureteroscope) may be used instead. Tell your health care provider about: Any allergies you have. All medicines you are taking, including vitamins, herbs, eye drops, creams, and over-the-counter medicines. Any problems you or family members have had with anesthetic medicines. Any bleeding problems you have. Any surgeries you have had. Any medical conditions you have. Whether you are pregnant or may be pregnant. What are the risks? Generally, this is a safe procedure. However, problems may occur, including: Bleeding. Infection. Allergic reactions to medicines. Damage to nearby structures   or organs. These may include the urethra, bladder, and ureters. Pain in the abdomen and pain when you urinate. Narrowing of the urethra from scar tissue. Trouble urinating from swelling. What happens before the procedure? Medicines Ask your health care provider about: Changing or stopping your regular medicines. This is especially important if you are taking diabetes medicines or blood thinners. Taking medicines such as aspirin and ibuprofen. These medicines can thin your  blood. Do not take these medicines unless your health care provider tells you to take them. Taking over-the-counter medicines, vitamins, herbs, and supplements. Surgery safety Ask your health care provider: How your surgery site will be marked. What steps will be taken to help prevent infection. These steps may include: Removing hair at the surgery site. Washing skin with a germ-killing soap. Receiving antibiotic medicine. General instructions Follow instructions from your health care provider about eating and drinking restrictions. You may be asked to drink plenty of fluids. You may be asked to urinate right before the procedure. Your urine may be tested for infection. If you will be going home right after the procedure, plan to have a responsible adult: Take you home from the hospital or clinic. You will not be allowed to drive. Care for you for the time you are told. What happens during the procedure?  An IV may be inserted into one of your veins. You may be given one or more of the following: A medicine to help you relax (sedative). A medicine to numb the opening of the urethra (local anesthetic). A medicine to make you fall asleep (general anesthetic). You will lie on your back with your knees bent and spread apart. The cystoscope or ureteroscope will be put into your urethra and guided into your bladder or ureters. Your bladder may be filled with germ-free (sterile) water. This will help your health care provider see the wall or lining of your bladder. Small instruments will be put through the scope to take a tissue sample to look at under a microscope. The procedure may vary among health care providers and hospitals. What happens after the procedure? Your blood pressure, heart rate, breathing rate, and blood oxygen level will be monitored until you leave the hospital or clinic. You may be asked to empty your bladder, or it may be emptied for you. If you were given a sedative  during the procedure, it can affect you for several hours. Do not drive or operate machinery until your health care provider says that it is safe. Summary A bladder biopsy is a procedure to remove a small tissue sample from the bladder. A bladder biopsy can be done to diagnose or rule out bladder cancer. Follow instructions from your health care provider about what you can eat or drink and about any changes to your medicines. Plan to have a responsible adult take you home and care for you as told. This information is not intended to replace advice given to you by your health care provider. Make sure you discuss any questions you have with your health care provider. Document Revised: 12/26/2020 Document Reviewed: 12/26/2020 Elsevier Patient Education  2023 Elsevier Inc.  

## 2021-08-04 NOTE — Progress Notes (Signed)
 08/04/21 4:24 PM   Jeremy [name] 06/05/1960 [id number]  CC: Gross hematuria, right-sided flank pain, nephrolithiasis  HPI: 61-year-old male with extensive cardiac history on dual antiplatelet therapy with most recent MI in September 2021.  He denies any recent chest pain or shortness of breath.  He has a distant history of cocaine abuse, but reports this was over 15 years ago.  He recently presented to the ER on 07/29/2021 with gross hematuria and some intermittent right-sided flank pain, and again on 07/30/2021.  A CT without contrast was performed showing a 1 cm right renal pelvis stone, but no other acute abnormalities.  Urinalysis at that time showed microscopic hematuria and culture was negative.  He denies any dysuria or urinary symptoms, no fevers or chills.  Denies any smoking history.  He has been holding his Brilinta, and gross hematuria has resolved.  He denies any flank pain today.  He has a history of a kidney stone on the right side in 2013 treated with ureteroscopy by Dr. Ottelin, and it sounds like he tolerated his stent well at that time.  Urinalysis today with 6-10 WBCs, greater than 30 RBCs, moderate bacteria, nitrite negative, trace leukocytes.  Will send for culture.   PMH: Past Medical History:  Diagnosis Date   Coronary artery disease CARDIOLOGIST-  DR HARWANI-- LOV  MAY 2013   (11-08-2011 DENIES CARDIAC SYMPTOMS)   History of acute myocardial infarction 12-21-2006--  ACUTE INFEROPOSTERIOR WALL MI   S/P PTCA W/ DE STENTS TO RCA    History of cocaine abuse (HCC)    Hyperlipidemia    Hypertension    Right ureteral stone     Surgical History: Past Surgical History:  Procedure Laterality Date   CARDIOVASCULAR STRESS TEST  04-26-2007  DR HARWANI   NORMAL STUDY/ NO ISCHEMIA   CORONARY ANGIOPLASTY WITH STENT PLACEMENT  12-18-2007  DR HARWANI   PTCA TO PROXIMAL RCA AND DRUG-ELUTING STENT IN PROXIMAL AND MID RCA   CORONARY STENT INTERVENTION N/A 10/20/2019    Procedure: CORONARY STENT INTERVENTION;  Surgeon: McAlhany, Christopher D, MD;  Location: [facility];  Service: Cardiovascular;  Laterality: N/A;   CORONARY/GRAFT ACUTE MI REVASCULARIZATION N/A 10/18/2019   Procedure: Coronary/Graft Acute MI Revascularization;  Surgeon: McAlhany, Christopher D, MD;  Location: [facility];  Service: Cardiovascular;  Laterality: N/A;   INGUINAL HERNIA REPAIR     BILATERAL   LEFT HEART CATH AND CORONARY ANGIOGRAPHY N/A 10/18/2019   Procedure: LEFT HEART CATH AND CORONARY ANGIOGRAPHY;  Surgeon: McAlhany, Christopher D, MD;  Location: [facility];  Service: Cardiovascular;  Laterality: N/A;   URETEROSCOPY  11/13/2011   Procedure: URETEROSCOPY;  Surgeon: Mark C Ottelin, MD;  Location: [facility];  Service: Urology;  Laterality: Right;  1 hour requested for this case  C-ARM CAMERA    Family History: No family history on file.  Social History:  reports that he quit smoking about 14 years ago. His smoking use included cigarettes. He has a 15.00 pack-year smoking history. He has quit using smokeless tobacco. He reports current alcohol use. He reports that he does not currently use drugs.  Physical Exam: BP 116/72 (BP Location: Left Arm, Patient Position: Sitting, Cuff Size: Normal)   Pulse 65   Ht 5' 8.5" (1.74 m)   Wt 165 lb (74.8 kg)   BMI 24.72 kg/m    Constitutional:  Alert and oriented, No acute distress. Cardiovascular: No clubbing, cyanosis, or edema. Respiratory: Normal respiratory effort,   no increased work of breathing. GI: Abdomen is soft, nontender, nondistended, no abdominal masses   Laboratory Data: Reviewed, see HPI  Pertinent Imaging: I have personally viewed and interpreted the CT showing a 1 cm right renal pelvis stone.  Assessment & Plan:   60 year old male with extensive cardiac history on dual antiplatelet therapy who presents with gross hematuria as well as some intermittent right-sided flank  pain.  CT shows a 1 cm right renal pelvis stone that is the likely cause of his flank pain and likely his gross hematuria as well.  We discussed that cannot rule out a bladder lesion without cystoscopy.  We discussed options including cystoscopy in clinic with follow-up for shockwave lithotripsy of the right-sided stone, versus cystoscopy, right ureteroscopy, laser lithotripsy, and stent placement, with possible biopsy or TURBT of any abnormal bladder lesions at that time.  Would also perform bilateral retrograde pyelograms to complete hematuria work-up.    Using shared decision making he opted to pursue OR for cystoscopy, bilateral retrograde pyelograms, right ureteroscopy, laser lithotripsy, stent placement, possible biopsy/fulguration/TURBT of any suspicious bladder lesions.  Will need cardiac clearance, will need to hold Brilinta, but okay to continue baby aspirin with his extensive cardiac history and stents  Legrand Rams, MD 08/04/2021  [facility] [address], [address] [place], [place] [address] [phone]

## 2021-08-05 ENCOUNTER — Telehealth: Payer: Self-pay | Admitting: Urgent Care

## 2021-08-05 ENCOUNTER — Encounter: Payer: Self-pay | Admitting: Physician Assistant

## 2021-08-05 ENCOUNTER — Encounter: Payer: Self-pay | Admitting: Urology

## 2021-08-05 ENCOUNTER — Telehealth: Payer: Self-pay | Admitting: *Deleted

## 2021-08-05 ENCOUNTER — Telehealth: Payer: Self-pay

## 2021-08-05 LAB — MICROSCOPIC EXAMINATION: RBC, Urine: >30 /hpf — AB (ref 0–2)

## 2021-08-05 LAB — URINALYSIS, COMPLETE
Bilirubin, UA: NEGATIVE
Glucose, UA: NEGATIVE
Ketones, UA: NEGATIVE
Nitrite, UA: NEGATIVE
Specific Gravity, UA: 1.02 (ref 1.005–1.030)
Urobilinogen, Ur: 0.2 mg/dL (ref 0.2–1.0)
pH, UA: 5.5 (ref 5.0–7.5)

## 2021-08-05 NOTE — Telephone Encounter (Signed)
Request for pre-operative cardiac clearance Received: Today Jeremy Kitchens, NP  P Cv Div Preop Callback Request for pre-operative cardiac clearance:     1. What type of surgery is being performed?  CYSTOSCOPY; URETEROSCOPY WITH RETROGRADE PYELOGRAMS; LASER LITHOTRIPSY; POSSIBLE BLADDER BIOPSY WITH FULGURATION.   2. When is this surgery scheduled?  08/12/2021     3. Type of clearance being requested (medical, pharmacy, both).  BOTH     4. Are there any medications that need to be held prior to surgery?  TICAGRELOR + ASA   5. Practice name and name of physician performing surgery?  Performing surgeon:  Dr. Nickolas Madrid, MD  Requesting clearance: Honor Loh, FNP-C       6. Anesthesia type (none, local, MAC, general)?  GENERAL   7. What is the office phone and fax number?    Phone: [phone]  Fax: [phone]   ATTENTION: Unable to create telephone message as per your standard workflow. Directed by [facility] providers to send requests for cardiac clearance to this pool for appropriate distribution to provider covering pre-operative clearances.   Honor Loh, MSN, APRN, FNP-C, CEN  [facility]  Peri-operative Services Nurse Practitioner  Phone: [phone]  08/05/21 8:31 AM

## 2021-08-05 NOTE — Progress Notes (Signed)
[facility] Posting Form   Surgery Date/Time: Date: 08/12/2021  Surgeon: Dr. Legrand Rams, MD  Surgery Location: Day Surgery  Inpt ( No  )   Outpt (Yes)   Obs ( No  )   Diagnosis: N20.1 Right Ureteropelvic Junction Stone, Gross Hematuria R31.0  -CPT: S321101, [id number], [id number], [id number]  Surgery: Cystoscopy with Bilateral Retrograde Pyelograms, Right Ureteroscopy with laser lithotripsy and stent placement, Possible bladder biopsy and possible fulguration  Stop Anticoagulations: Yes, may continue baby ASA  Cardiac/Medical/Pulmonary Clearance needed: yes  Clearance needed from Dr: Ulyses Southward  Clearance request sent on: Date: 08/05/21 by Quentin Mulling, NP with Peri-operative services  *Orders entered into EPIC  Date: 08/05/21   *Case booked in EPIC  Date: 08/05/21  *Notified pt of Surgery: Date: 08/05/21  PRE-OP UA & CX: yes, sent while in clinic yesterday  *Placed into Prior Authorization Work Angela Nevin Date: 08/05/21   Assistant/laser/rep:No

## 2021-08-05 NOTE — Telephone Encounter (Signed)
I spoke with Mr. Yeley. We have discussed possible surgery dates and Friday July 14th, 2023 was agreed upon by all parties. Patient given information about surgery date, what to expect pre-operatively and post operatively.  We discussed that a Pre-Admission Testing office will be calling to set up the pre-op visit that will take place prior to surgery, and that these appointments are typically done over the phone with a Pre-Admissions RN.  Informed patient that our office will communicate any additional care to be provided after surgery. Patients questions or concerns were discussed during our call. Advised to call our office should there be any additional information, questions or concerns that arise. Patient verbalized understanding.

## 2021-08-05 NOTE — Telephone Encounter (Signed)
I called Dr. Annitta Jersey office and confirmed the pt is seen by their office for 14 yrs. I stated to Dr. Annitta Jersey office that our practice had been asked to give cardiac clearance. I was instructed per Dr. Annitta Jersey office to fax clearance request to them.   I d/w Ronie Spies, PAC further with the conclusion that we will cancel our appt 08/08/21 @ 2:45. Left message for the pt that cardiac clearance will need to come from Dr. Sharyn Lull. I will update the requesting office tom please fax clearance to Dr. Sharyn Lull fax #[phone]

## 2021-08-05 NOTE — Telephone Encounter (Signed)
Pt has agreeable to in office appt. Pt had cath 07/2019 with Dr. Clifton James, though does not look like he ever followed up post cath. Pt has been scheduled to see Ronie Spies, [facility] 08/08/21 @ 2:45 per pt request for afternoon appt.   I will update the PAC and the requesting office.

## 2021-08-05 NOTE — Telephone Encounter (Signed)
   Name: Jeremy [name]  DOB: [birth year]  MRN: [id number]  Primary Cardiologist: [name], MD  Chart reviewed as part of pre-operative protocol coverage. Because of Jeremy [name]'s past medical history and time since last visit, he will require a follow-up in-office visit in order to better assess preoperative cardiovascular risk.  Pre-op covering staff: - Please schedule appointment and call patient to inform them. If patient already had an upcoming appointment within acceptable timeframe, please add "pre-op clearance" to the appointment notes so provider is aware. - Please contact requesting surgeon's office via preferred method (i.e, phone, fax) to inform them of need for appointment prior to surgery.  This message will also be routed to Dr. Clifton James for input on holding Aspirin and Brilinta as requested below so that this information is available to the clearing provider at time of patient's appointment.   Joylene Grapes, NP  08/05/2021, 10:57 AM

## 2021-08-05 NOTE — Progress Notes (Addendum)
  Perioperative Services Pre-Admission/Anesthesia Testing     Date: 08/05/21  Name: Jeremy [name] MRN:   [id number]  Re: Request from surgery for clearance prior to scheduled procedure  Patient is scheduled to undergo a CYSTOSCOPY; URETEROSCOPY WITH RETROGRADE PYELOGRAMS; LASER LITHOTRIPSY; POSSIBLE BLADDER BIOPSY WITH FULGURATION on 08/12/2021 with Dr. Legrand Rams, MD. Patient has not been scheduled for his PAT appointment at this point, thus has not undergone review by PAT RN and/or APP. Received communication from primary attending surgeon's office requesting that patient be submitted for clearance from cardiology.  PROVIDER SPECIALTY FAXED TO   [facility]   Cardiology  Routed to APP pool in [facility]   Plan:  Clearance documents generated and faxed to appropriate provider(s) as noted above.  Of note, it appears as if patient has not been seen by cardiology since 10/2019. He will likely be considered a new patient to the [facility] practice at this point. I am unsure of availability for a new patient appointment, however I will proceed with sending in order to start the process. Will notify the surgeon's office. Notes will be updated to reflect communication with provider's office as it relates to clearance being provided and/or the need for office visit prior to clearance for surgery being issued.   ADDENDUM 08/05/2021 at 1815 PM: Received communication from [employer] indicating that this patient has never been seen by their practice following discharge from the hospital. When I looked in [facility], I did not see any notes from anyone except [facility] providers, thus I assumed patient was going to follow up with them. Patient apparently has been followed by Dr. Rinaldo Cloud, MD for 14 years. This information was confirmed with Dr. Annitta Jersey office by [facility] staff Denyse Amass, CMA).  Patient accepted an appointment earlier today with [facility] provider for 08/08/2021, however in light of  the fact that Dr. Sharyn Lull is his primary cardiologist, previously scheduled appointment with [facility] provider will need to be canceled and clearance for the upcoming procedure will need to come from Dr. Annitta Jersey office.  Clearance document was generated and forwarded to Dr. Sharyn Lull. Updated noted forwarded to surgery scheduler with urology to make the aware of clearance status for this patient.   Quentin Mulling, MSN, APRN, FNP-C, CEN [facility]  Peri-operative Services Nurse Practitioner Phone: [phone] 08/05/21 8:34 AM  NOTE: This note has been prepared using Dragon dictation software. Despite my best ability to proofread, there is always the potential that unintentional transcriptional errors may still occur from this process.

## 2021-08-05 NOTE — Telephone Encounter (Signed)
I left a detailed message for the pt. Jeremy [name], PAC called me and asked if I could please f/u with the pt to be sure that he is no longer following his cardiac care with Dr. Sharyn Lull or is he wanting to change care to [facility]. If pt would like to change care to [facility] we are more than happy to send him. If he is however, still following with Dr. Annitta Jersey practice, the pt should then have the pre op clearance from their office. I left  a very detailed message for the pt to call back before if he can. I left my direct ext .

## 2021-08-05 NOTE — Progress Notes (Deleted)
Cardiology Office Note    Date:  08/05/2021   ID:  Jeremy [name], DOB November 27, 1960, MRN [id number]  PCP:  Pcp, No  Cardiologist:  Previously Dr. Terrence Dupont *** Electrophysiologist:  None   Chief Complaint: ***  History of Present Illness:   HARSHAAN [name] is a 61 y.o. male with history of CAD (inferior STEMI 2008 s/p 2 overlapping DES to prox-mid RCA, anterior STEMI 10/2019 s/p DESx2 to mLAD, DES to [facility], DES to OM1), HTN, HLD, ischemic cardiomyopathy, mild dilation of aortic root, former tobacco abuse, prior cocaine abuse per chart who presents for overdue follow-up and preop evaluation.   He was admitted 10/2019 with anterior STEMI and received DESx2 to mid LAD with staged PTCA/DESx1 to restenosis of mRCA and DES to OM1. Echo showed ICM EF 40% + WMA, moderate LVH, mild dilation of aortic root. He was advised to f/u with Dr. Terrence Dupont at discharge, his cardiology at the time ([facility] was providing interventional services). We recently received pre-op clearance request for cystoscopy, ureteroscopy with retrograde pyelograms, laser lithotripsy, and possible bladder biopsy with fulguration.  ASA+Brilinta Repeat echo for LV dysfun mild dilated root No lipids/LFTs recently  Preoperative cardiovascular evaluation CAD with HLD Ischemic cardiomyopathy Mild dilation of aortic root Essential HTN  Labwork independently reviewed: 06/2021 CBC wnl, BMET wnl K 4.0, Cr 0.82 2021 A1C 5.1, LFTs ok, LDL 96, HDL 39  Cardiology Studies:   Studies reviewed are outlined and summarized above. Reports included below if pertinent.   PCI 10/20/2019 Previously placed Mid LAD-2 drug eluting stent is widely patent. Balloon angioplasty was performed. Previously placed Mid LAD-1 drug eluting stent is widely patent. Balloon angioplasty was performed. 1st Mrg lesion is 80% stenosed. Prox RCA lesion is 65% stenosed. Mid RCA lesion is 50% stenosed. Dist RCA lesion is 99% stenosed. Scoring balloon angioplasty  was performed using a BALLOON WOLVERINE 3.00X15. Post intervention, there is a 30% residual stenosis. A drug-eluting stent was successfully placed using a SYNERGY XD 3.50X16. Post intervention, there is a 0% residual stenosis. A drug-eluting stent was successfully placed using a SYNERGY XD 2.75X16. Post intervention, there is a 0% residual stenosis.   1. Severe stenosis distal RCA. Successful PTCA/DES x 1 distal RCA 2. Moderate to severe restenosis in the proximal to mid RCA. Successful scoring balloon angioplasty proximal to mid RCA.  3. Severe stenosis first obtuse marginal branch. Successful PTCA/DES x 1 OM1.    Recommendations: Continue DAPT with ASA and Brilinta for one year. Consider continuing DAPT for lifetime given his first generation DES in the RCA and now with multi-vessel CAD. Continue statin and beta blocker. Likely d/c home tomorrow.    2D echo 10/19/19   1. The apex is akinetic. Marland Kitchen Left ventricular ejection fraction, by  estimation, is 40%. The left ventricle has mildly decreased function. The  left ventricle demonstrates regional wall motion abnormalities (see  scoring diagram/findings for description).  There is moderate left ventricular hypertrophy. Left ventricular diastolic  parameters were normal.   2. Right ventricular systolic function was not well visualized. The right  ventricular size is not well visualized.   3. The mitral valve was not well visualized. No evidence of mitral valve  regurgitation. No evidence of mitral stenosis.   4. The aortic valve was not well visualized. Aortic valve regurgitation  is not visualized. No aortic stenosis is present.   5. Aortic dilatation noted. There is mild dilatation of the aortic root,  measuring 41 mm.   Initial cath 10/18/19  Prox RCA lesion is 65% stenosed. Mid RCA lesion is 50% stenosed. Dist RCA lesion is 99% stenosed. 1st Mrg lesion is 80% stenosed. Mid LAD-1 lesion is 70% stenosed. Mid LAD-2 lesion is 100%  stenosed. A drug-eluting stent was successfully placed using a SYNERGY XD 2.75X24. Post intervention, there is a 0% residual stenosis. A drug-eluting stent was successfully placed using a SYNERGY XD 2.75X12. Post intervention, there is a 0% residual stenosis. The left ventricular systolic function is normal. LV end diastolic pressure is normal.   1. Acute anterior STEMI secondary to thrombotic occlusion of the mid LAD 2. Successful PTCA/DES x 2 mid LAD 3. Severe stenosis first obtuse marginal branch 4. The RCA is a large dominant vessel with a patent proximal stented segment. There is moderate restenosis in the stents. Severe distal RCA stenosis.  5. Segmental LV systolic dysfunction. Hand injection of contrast and unable to fully comment on LVEF.    Recommendations: Will admit to the ICU. Will continue Aggrastat drip x 2 hours. Will continue DAPT with ASA and Brilinta for at least 12 months. Continue beta blocker. Will start high intensity statin. Echo in the am. Will need staged PCI of the RCA and the Circumflex/OM lesions next week. He is followed by Dr. Sharyn Lull. Dr. Sharyn Lull is aware of his admission.     Past Medical History:  Diagnosis Date   Coronary artery disease CARDIOLOGIST-  DR HARWANI-- LOV  MAY 2013   (11-08-2011 DENIES CARDIAC SYMPTOMS)   History of acute myocardial infarction 12-21-2006--  ACUTE INFEROPOSTERIOR WALL MI   S/P PTCA W/ DE STENTS TO RCA    History of cocaine abuse (HCC)    Hyperlipidemia    Hypertension    Right ureteral stone     Past Surgical History:  Procedure Laterality Date   CARDIOVASCULAR STRESS TEST  04-26-2007  DR HARWANI   NORMAL STUDY/ NO ISCHEMIA   CORONARY ANGIOPLASTY WITH STENT PLACEMENT  12-18-2007  DR HARWANI   PTCA TO PROXIMAL RCA AND DRUG-ELUTING STENT IN PROXIMAL AND MID RCA   CORONARY STENT INTERVENTION N/A 10/20/2019   Procedure: CORONARY STENT INTERVENTION;  Surgeon: Kathleene Hazel, MD;  Location: [facility] INVASIVE CV [facility];   Service: Cardiovascular;  Laterality: N/A;   CORONARY/GRAFT ACUTE MI REVASCULARIZATION N/A 10/18/2019   Procedure: Coronary/Graft Acute MI Revascularization;  Surgeon: Kathleene Hazel, MD;  Location: [facility] CV [facility];  Service: Cardiovascular;  Laterality: N/A;   INGUINAL HERNIA REPAIR     BILATERAL   LEFT HEART CATH AND CORONARY ANGIOGRAPHY N/A 10/18/2019   Procedure: LEFT HEART CATH AND CORONARY ANGIOGRAPHY;  Surgeon: Kathleene Hazel, MD;  Location: [facility] CV [facility];  Service: Cardiovascular;  Laterality: N/A;   URETEROSCOPY  11/13/2011   Procedure: URETEROSCOPY;  Surgeon: Garnett Farm, MD;  Location: [facility];  Service: Urology;  Laterality: Right;  1 hour requested for this case  C-ARM CAMERA     Current Medications: No outpatient medications have been marked as taking for the 08/08/21 encounter (Appointment) with Laurann Montana, PA-C.   ***   Allergies:   Patient has no known allergies.   Social History   Socioeconomic History   Marital status: Single    Spouse name: Not on file   Number of children: Not on file   Years of education: Not on file   Highest education level: Not on file  Occupational History   Not on file  Tobacco Use   Smoking status: Former  Packs/day: 1.00    Years: 15.00    Total pack years: 15.00    Types: Cigarettes    Quit date: 12/18/2006    Years since quitting: 14.6   Smokeless tobacco: Former  Building services engineer Use: Never used  Substance and Sexual Activity   Alcohol use: Yes    Comment: HISTORY ALCOHOL ABUSE BEFORE 2008. ocassional now.   Drug use: Not Currently    Comment: HISTORY COCAINE USE PER ECHART DOCUMENTATION (LAST SMOKED  FEW YRS BEFORE 2008)   Sexual activity: Not Currently  Other Topics Concern   Not on file  Social History Narrative   Not on file   Social Determinants of Health   Financial Resource Strain: Not on file  Food Insecurity: Not on file  Transportation Needs: Not on  file  Physical Activity: Not on file  Stress: Not on file  Social Connections: Not on file     Family History:  The patient's ***family history is not on file.  ROS:   Please see the history of present illness. Otherwise, review of systems is positive for ***.  All other systems are reviewed and otherwise negative.    EKG(s)/Additional Labs   EKG:  EKG is ordered today, personally reviewed, demonstrating ***  Recent Labs: 07/29/2021: BUN 12; Creatinine, Ser 0.82; Hemoglobin 14.5; Platelets 304; Potassium 4.0; Sodium 140  Recent Lipid Panel    Component Value Date/Time   CHOL 151 10/18/2019 1555   TRIG 79 10/18/2019 1555   HDL 39 (L) 10/18/2019 1555   CHOLHDL 3.9 10/18/2019 1555   VLDL 16 10/18/2019 1555   LDLCALC 96 10/18/2019 1555    PHYSICAL EXAM:    VS:  There were no vitals taken for this visit.  BMI: There is no height or weight on file to calculate BMI.  GEN: Well nourished, well developed male in no acute distress HEENT: normocephalic, atraumatic Neck: no JVD, carotid bruits, or masses Cardiac: ***RRR; no murmurs, rubs, or gallops, no edema  Respiratory:  clear to auscultation bilaterally, normal work of breathing GI: soft, nontender, nondistended, + BS MS: no deformity or atrophy Skin: warm and dry, no rash Neuro:  Alert and Oriented x 3, Strength and sensation are intact, follows commands Psych: euthymic mood, full affect  Wt Readings from Last 3 Encounters:  08/04/21 165 lb (74.8 kg)  07/30/21 165 lb 5.5 oz (75 kg)  07/29/21 165 lb 5.5 oz (75 kg)     ASSESSMENT & PLAN:   ***     Disposition: F/u with ***   Medication Adjustments/Labs and Tests Ordered: Current medicines are reviewed at length with the patient today.  Concerns regarding medicines are outlined above. Medication changes, Labs and Tests ordered today are summarized above and listed in the Patient Instructions accessible in Encounters.   Signed, Laurann Montana, PA-C  08/05/2021 2:08  PM    [facility] Phone: [phone]; Fax: [phone]

## 2021-08-08 ENCOUNTER — Ambulatory Visit: Payer: No Typology Code available for payment source | Admitting: Physician Assistant

## 2021-08-08 ENCOUNTER — Telehealth: Payer: Self-pay | Admitting: Urgent Care

## 2021-08-08 LAB — CULTURE, URINE COMPREHENSIVE

## 2021-08-08 NOTE — Progress Notes (Signed)
  Perioperative Services Pre-Admission/Anesthesia Testing     Date: 08/08/21  Name: HEBERTO STURDEVANT MRN:   [id number]  Re: Surgical clearance  Surgical clearance received from Dr. Rinaldo Cloud, MD (cardiology). Patient has been cleared for the planned CYSTOSCOPY WITH BILATERAL RETROGRADE PYELOGRAMS; RIGHT URETEROSCOPY/HOLMIUM LASER/STENT PLACEMENT; BLADDER BIOPSY WITH FULGURATION scheduled for 08/12/2021 with Dr. Legrand Rams, MD.  Cardiology notes that patient may proceed with an overall ACCEPTABLE risk stratification. Per office note received from Dr. Sharyn Lull, ticagrelor was discontinued as of 07/29/2021. I noted this on the clearance form and discontinued this medication from his current list on file with [place] her at [facility]. Additionally, patient has been cleared to hold his daily low dose ASA dose for 7 days prior to his procedure if required by Dr. Richardo Hanks. Patient to restart as soon as postoperative bleeding risk to be minimized by his primary attending surgeon. Copy of signed clearance form placed on patient's OR chart for review by the surgical/anesthetic team on the day of his procedure.   Quentin Mulling, MSN, APRN, FNP-C, CEN [facility]  Peri-operative Services Nurse Practitioner Phone: [phone] 08/08/21 11:45 AM

## 2021-08-09 ENCOUNTER — Telehealth: Payer: Self-pay

## 2021-08-09 ENCOUNTER — Inpatient Hospital Stay
Admission: RE | Admit: 2021-08-09 | Discharge: 2021-08-09 | Disposition: A | Discharge status: Home / Self Care | Payer: No Typology Code available for payment source | Source: Ambulatory Visit

## 2021-08-09 HISTORY — DX: Personal history of urinary calculi: Z87.442

## 2021-08-09 MED ORDER — SULFAMETHOXAZOLE-TRIMETHOPRIM 800-160 MG PO TABS: 1.0000 | ORAL_TABLET | Freq: Two times a day (BID) | ORAL | 0 refills | Status: DC

## 2021-08-09 NOTE — Telephone Encounter (Signed)
Sent Rx to [facility]. Tried calling patient, no answer. Left voicemail to return my call.

## 2021-08-09 NOTE — Telephone Encounter (Signed)
-----   Message from Sondra Come, MD sent at 08/09/2021  7:59 AM EDT ----- Urine grew a small amount of bacteria, please start Bactrim DS BID x 3 days, thanks  Legrand Rams, MD 08/09/2021

## 2021-08-09 NOTE — Telephone Encounter (Signed)
Spoke with pt. Pt. Advised of needing to start medication. Verbalized understanding.

## 2021-08-09 NOTE — Patient Instructions (Addendum)
Your procedure is scheduled on: Friday, July 14 Report to the Registration Desk on the 1st floor of the [facility] To find out your arrival time, please call [phone] between 1PM - 3PM on: Thursday, July 13 If your arrival time is 6:00 am, do not arrive prior to that time as the [facility] entrance doors do not open until 6:00 am.  REMEMBER: Instructions that are not followed completely may result in serious medical risk, up to and including death; or upon the discretion of your surgeon and anesthesiologist your surgery may need to be rescheduled.  Do not eat or drink after midnight the night before surgery.  No gum chewing, lozengers or hard candies.  TAKE THESE MEDICATIONS THE MORNING OF SURGERY WITH A SIP OF WATER:  Atorvastatin Metoprolol  One week prior to surgery: starting today, July 11 Stop Anti-inflammatories (NSAIDS) such as Advil, Aleve, Ibuprofen, Motrin, Naproxen, Naprosyn and Aspirin based products such as Excedrin, Goodys Powder, BC Powder. Stop ANY OVER THE COUNTER supplements until after surgery. You may however, continue to take Tylenol if needed for pain up until the day of surgery.  Per Dr. Richardo Hanks; you may continue to take the 81 mg aspirin.   No Alcohol for 24 hours before or after surgery.  No Smoking including e-cigarettes for 24 hours prior to surgery.  No chewable tobacco products for at least 6 hours prior to surgery.  No nicotine patches on the day of surgery.  Do not use any "recreational" drugs for at least a week prior to your surgery.  Please be advised that the combination of cocaine and anesthesia may have negative outcomes, up to and including death. If you test positive for cocaine, your surgery will be cancelled.  On the morning of surgery brush your teeth with toothpaste and water, you may rinse your mouth with mouthwash if you wish. Do not swallow any toothpaste or mouthwash.  Use CHG Soap or wipes as directed on instruction  sheet.  Do not wear jewelry, make-up, hairpins, clips or nail polish.  Do not wear lotions, powders, or perfumes.   Do not shave body from the neck down 48 hours prior to surgery just in case you cut yourself which could leave a site for infection.  Also, freshly shaved skin may become irritated if using the CHG soap.  Contact lenses, hearing aids and dentures may not be worn into surgery.  Do not bring valuables to the hospital. [facility] is not responsible for any missing/lost belongings or valuables.   Bring your C-PAP to the hospital with you in case you may have to spend the night.   Notify your doctor if there is any change in your medical condition (cold, fever, infection).  Wear comfortable clothing (specific to your surgery type) to the hospital.  After surgery, you can help prevent lung complications by doing breathing exercises.  Take deep breaths and cough every 1-2 hours. Your doctor may order a device called an Incentive Spirometer to help you take deep breaths.  If you are being discharged the day of surgery, you will not be allowed to drive home. You will need a responsible adult (18 years or older) to drive you home and stay with you that night.   If you are taking public transportation, you will need to have a responsible adult (18 years or older) with you. Please confirm with your physician that it is acceptable to use public transportation.   Please call the Pre-admissions Testing Dept. at [phone] if you have any questions about these instructions.  Surgery Visitation Policy:  Patients undergoing a surgery or procedure may have two family members or support persons with them as long as the person is not COVID-19 positive or experiencing its symptoms.

## 2021-08-10 ENCOUNTER — Encounter: Payer: Self-pay | Admitting: Urology

## 2021-08-10 ENCOUNTER — Encounter
Admission: RE | Admit: 2021-08-10 | Discharge: 2021-08-10 | Disposition: A | Discharge status: Home / Self Care | Payer: No Typology Code available for payment source | Source: Ambulatory Visit | Attending: Urology | Admitting: Urology

## 2021-08-10 NOTE — Progress Notes (Signed)
Perioperative Services  Pre-Admission/Anesthesia Testing Clinical Review  Date: 08/10/21  Patient Demographics:  Name: Jeremy [name] DOB:   [birth year] MRN:   [id number]  Planned Surgical Procedure(s):    Case: [id number] Date/Time: 08/12/21 1341   Procedures:      CYSTOSCOPY WITH RETROGRADE PYELOGRAM (Bilateral)     CYSTOSCOPY/URETEROSCOPY/HOLMIUM LASER/STENT PLACEMENT (Right)     CYSTOSCOPY WITH BLADDER BIOPSY     CYSTOSCOPY WITH FULGURATION   Anesthesia type: General   Pre-op diagnosis: Right Ureteropelvic Junction Stone, Gross Hematuria   Location: [facility] OR ROOM 10 / [facility]   Surgeons: Sondra ComeSninsky, Brian C, MD   NOTE: Available PAT nursing documentation and vital signs have been reviewed. Clinical nursing staff has updated patient's PMH/[name], current medication list, and drug allergies/intolerances to ensure comprehensive history available to assist in medical decision making as it pertains to the aforementioned surgical procedure and anticipated anesthetic course. Extensive review of available clinical information performed. Jeremy [name] updated with any diagnoses/procedures that  may have been inadvertently omitted during his intake with the pre-admission testing department's nursing staff.  Clinical Discussion:  Jeremy [name] is a 61 y.o. male who is submitted for pre-surgical anesthesia review and clearance prior to him undergoing the above procedure. Patient is a Former Smoker (15 pack years; quit 12/2006). Pertinent PMH includes: CAD, STEMI x 2, ventricular fibrillation, ischemic cardiomyopathy, dilated aortic root, aortic atherosclerosis, HTN, HLD, nephrolithiasis, prostatomegaly, history of cocaine and ETOH abuse.  Patient is followed by cardiology Jeremy [name](Harwani, MD). He was last seen in the cardiology clinic on 07/29/2021; notes reviewed. At the time of his clinic visit, the patient denied any chest pain, shortness of breath, PND, orthopnea,  palpitations, significant peripheral edema, vertiginous symptoms, or presyncope/syncope.  Patient with a PMH significant for cardiovascular diagnoses.  Patient suffered an inferoposterior wall STEMI on 12/18/2006.  Diagnostic left heart catheterization was performed revealing a low normal left ventricular systolic function with an EF of 50-55%.  Multivessel CAD noted; 30-35% mid LAD, 60-70% proximal LCx, 40-50% mid LCx, 20-25% proximal OM1, and 100% proximal RCA.  Subsequent PCI was performed placing overlapping 3.5 x 23 mm Cypher DES x 2 to the proximal and mid RCA yielding excellent angiographic result and TIMI-3 flow.  Myocardial perfusion imaging study performed on 04/26/2007 revealing a normal left ventricular systolic function with an EF of 66%.  There was no evidence of significant myocardial ischemia or arrhythmia noted; no scintigraphic evidence of scar.  Study determined to be normal and low risk.  Patient suffered and anterior wall STEMI on 10/18/2019.  Diagnostic left heart catheterization was performed revealing multivessel CAD; 99% distal RCA, 80% OM1, 70% mid LAD-1, and 100% mid LAD-2.  Additionally, there was ISR noted within the previously placed stents; 65% proximal RCA and 50% mid RCA.  PCI was performed placing a 2.75 x 12 mm Synergy XD DES x1 to the mid LAD-1 and a 2.75 x 24 mm Synergy XD DES x1 to the mid LAD-2 lesion yielding 0% residual stenosis and TIMI-3 flow.  Aggrastat drip continued.  Patient transferred to ICU with plans for staged PCI of the RCA at a later date.  TTE performed on 10/19/2019 revealing mildly reduced left ventricular systolic function with an EF of 40%.  The apex was akinetic.  Left ventricular regional wall motion abnormality (hypokinesis) noted.  There was no significant valvular regurgitation noted.  No evidence of a significant transvalvular gradient to suggest stenosis.  Aortic root mildly dilated measuring  41 mm.  Patient underwent staged PCI procedure on  10/20/2019.  PTCA of the proximal to mid RCA was performed.  Additionally, 2.75 x 16 mm Synergy XD DES x1 was placed to the OM1 lesion, in addition to a 3.50 x 16 mm Synergy XD DES to the distal RCA lesion.  Procedure yielded excellent angiographic result with 0% residual stenosis and TIMI-3 flow being noted.  Following MI and subsequent PCI in 10/2019, patient has remained on daily DAPT therapy (ASA + ticagrelor).  Patient has been compliant with therapy with no evidence or reports of GI bleeding.  Decision was made to discontinue ticagrelor during the visit.  Blood pressures well controlled at 119/77 on currently prescribed beta-blocker monotherapy.  Patient is on a statin for his HLD and further ASCVD prevention.  He is not diabetic. Patient has a supply of short acting nitrates (NTG) to use on an as needed basis; denied recent use. Functional capacity, as defined by DASI, is documented as being >/= 4 METS. No changes were made to his medication regimen.  Patient to follow-up with outpatient cardiology in 3 months or sooner if needed.  Jeremy [name] is scheduled for an CYSTOSCOPY WITH BILATERAL RETROGRADE PYELOGRAMS; RIGHT URETEROSCOPY/HOLMIUM LASER/STENT PLACEMENT; BLADDER BIOPSY WITH FULGURATION on 08/12/2021 with Dr. Legrand Rams, MD. Given patient's past medical history significant for cardiovascular diagnoses, presurgical cardiac clearance was sought by the PAT team. Per cardiology, "this patient is optimized for surgery and may proceed with the planned procedural course with an ACCEPTABLE risk of significant perioperative cardiovascular complications".  In review of his medication reconciliation, it is noted the patient is on daily antiplatelet therapy.  As per instructions from primary attending surgeon, patient may continue his daily low-dose ASA dose throughout his perioperative course.  Patient denies previous perioperative complications with anesthesia in the past. In review of the available  records, it is noted that patient underwent a general anesthetic course at [facility] (ASA II) in 10/2011 without documented complications.      08/04/2021    3:06 PM 07/30/2021    2:55 PM 07/30/2021    1:29 PM  Vitals with BMI  Height 5' 8.5"    Weight 165 lbs    BMI 24.72    Systolic 116 120 220  Diastolic 72 80 74  Pulse 65 70 67    Providers/Specialists:   NOTE: Primary physician provider listed below. Patient may have been seen by APP or partner within same practice.   PROVIDER ROLE / SPECIALTY LAST Lise Auer, MD Urology (Surgeon) 08/04/2021  Pcp, No Primary Care Provider ???  Rinaldo Cloud, MD Cardiology 07/29/2021   Allergies:  Patient has no known allergies.  Current Home Medications:   No current facility-administered medications for this encounter.    aspirin EC 81 MG EC tablet   atorvastatin (LIPITOR) 80 MG tablet   metoprolol tartrate (LOPRESSOR) 25 MG tablet   nitroGLYCERIN (NITROSTAT) 0.4 MG SL tablet   sulfamethoxazole-trimethoprim (BACTRIM DS) 800-160 MG tablet   History:   Past Medical History:  Diagnosis Date   Acute ST elevation myocardial infarction (STEMI) of anterior wall (HCC) 10/18/2019   a.) LHC/PCI 10/18/2019: 65% ISR pRCA, 50% ISR mRCA, 99% dRCA, 80% OM1, 70% mLAD-1 (2.75 x 12 mm Synergy DES), 100% mLAD-2 (2.75 x 31mm Synergy DES); b.) staged PCI: PTCA p-mRCA, 2.75 x 52mm Synergy DES OM1, 3.50 x 56mm Synergy DES dRCA   Acute ST elevation myocardial infarction (STEMI) of inferoposterior wall (HCC)  12/18/2006   a.) LHC/PCI 12/18/2006: 30-35% mLAD, 60-70% pLCx, 40-50% mLCx, 20-25% pOM1, 100% pRCA (overlapping 3.5 x 75mm Cypher DES pRCA and mRCA)   Aortic atherosclerosis (HCC)    Coronary artery disease 12/18/2006   a.) Inferoposterior STEMI -> LHC/PCI: 30-35% mLAD, 60-70% pLCx, 40-50% mLCx, 20-25% pOM1, 100% pRCA (overlapping 3.5x68mm Cypher DES pRCA and mRCA); b.) anterior STEMI 10/18/2019: LHC/PCI: 65% ISR pRCA, 50% ISR  mRCA, 99% dRCA, 80% OM1, 70% mLAD-1 (2.75x12 mm Synergy DES), 100% mLAD-2 (2.75x3mm Synergy DES); c.) staged PCI: PTCA p-mRCA, 2.75x16mm Synergy DES OM1, 3.50x29mm Synergy DES dRCA   Dilated aortic root (HCC) 10/19/2019   a.) TTE 10/19/2019: Ao root measured 41 mm   Diverticulosis    Enlarged prostate without urinary obstruction    Former tobacco use    History of cocaine abuse (HCC)    History of ETOH abuse    History of kidney stones    Hyperlipidemia    Hypertension    Ischemic cardiomyopathy    Long term current use of antithrombotics/antiplatelets    a.) daily ASA; ticagrelor d/c'd 07/29/2021   Ventricular fibrillation (HCC) 12/18/2006   a.) single episode x 1 in setting of inferoposterior STEMI; defib x 1 restoring NSR; Tx'd with amiodarone.   Past Surgical History:  Procedure Laterality Date   CORONARY ANGIOPLASTY WITH STENT PLACEMENT  12/18/2006   Procedure: CORONARY ANGIOPLASTY WITH STENT PLACEMENT; Location: [facility]; Surgeon: Rinaldo Cloud, MD   CORONARY STENT INTERVENTION N/A 10/20/2019   Procedure: CORONARY STENT INTERVENTION;  Surgeon: Kathleene Hazel, MD;  Location: [facility];  Service: Cardiovascular;  Laterality: N/A;   CORONARY/GRAFT ACUTE MI REVASCULARIZATION N/A 10/18/2019   Procedure: Coronary/Graft Acute MI Revascularization;  Surgeon: Kathleene Hazel, MD;  Location: [facility];  Service: Cardiovascular;  Laterality: N/A;   INGUINAL HERNIA REPAIR Bilateral    LEFT HEART CATH AND CORONARY ANGIOGRAPHY N/A 10/18/2019   Procedure: LEFT HEART CATH AND CORONARY ANGIOGRAPHY;  Surgeon: Kathleene Hazel, MD;  Location: [facility];  Service: Cardiovascular;  Laterality: N/A;   URETEROSCOPY  11/13/2011   Procedure: URETEROSCOPY;  Surgeon: Garnett Farm, MD;  Location: [facility];  Service: Urology;  Laterality: Right   No family history on file. Social History   Tobacco Use   Smoking status: Former     Packs/day: 1.00    Years: 15.00    Total pack years: 15.00    Types: Cigarettes    Quit date: 12/18/2006    Years since quitting: 14.6   Smokeless tobacco: Former  Building services engineer Use: Some days   Substances: Nicotine, Flavoring  Substance Use Topics   Alcohol use: Yes    Comment: HISTORY ALCOHOL ABUSE BEFORE 2008. ocassional now.   Drug use: Not Currently    Comment: HISTORY COCAINE USE PER ECHART DOCUMENTATION (LAST SMOKED  FEW YRS BEFORE 2008)    Pertinent Clinical Results:  LABS: Labs reviewed: Acceptable for surgery.  Lab Results  Component Value Date   WBC 6.2 07/29/2021   HGB 14.5 07/29/2021   HCT 41.6 07/29/2021   MCV 87.9 07/29/2021   PLT 304 07/29/2021   Lab Results  Component Value Date   NA 140 07/29/2021   K 4.0 07/29/2021   CO2 23 07/29/2021   GLUCOSE 97 07/29/2021   BUN 12 07/29/2021   CREATININE 0.82 07/29/2021   CALCIUM 9.0 07/29/2021   GFRNONAA >60 07/29/2021   Component Date Value Ref Range Status  Specific Gravity, UA 08/04/2021 1.020  1.005 - 1.030 Final   pH, UA 08/04/2021 5.5  5.0 - 7.5 Final   Color, UA 08/04/2021 Yellow  Yellow Final   Appearance Ur 08/04/2021 Clear  Clear Final   Leukocytes,UA 08/04/2021 Trace (A)  Negative Final   Protein,UA 08/04/2021 Trace (A)  Negative/Trace Final   Glucose, UA 08/04/2021 Negative  Negative Final   Ketones, UA 08/04/2021 Negative  Negative Final   RBC, UA 08/04/2021 2+ (A)  Negative Final   Bilirubin, UA 08/04/2021 Negative  Negative Final   Urobilinogen, Ur 08/04/2021 0.2  0.2 - 1.0 mg/dL Final   Nitrite, UA 40/98/1191 Negative  Negative Final   Microscopic Examination 08/04/2021 See below:   Final   Urine Culture, Comprehensive 08/04/2021 Final report (A)   Final   Organism ID, Bacteria 08/04/2021 Comment (A)   Final   Comment: Staphylococcus haemolyticus Based on resistance to oxacillin this isolate would be resistant to all currently available beta-lactam antimicrobial agents, with the  exception of the newer cephalosporins with anti-MRSA activity, such as Ceftaroline 200 Colonies/mL                                                      .   ANTIMICROBIAL SUSCEPTIBILITY 08/04/2021 Comment   Final   Comment:       ** S = Susceptible; I = Intermediate; R = Resistant **                    P = Positive; N = Negative             MICS are expressed in micrograms per mL    Antibiotic                 RSLT#1    RSLT#2    RSLT#3    RSLT#4 Ciprofloxacin                  S Gentamicin                     S Levofloxacin                   S Linezolid                      S Moxifloxacin                   S Nitrofurantoin                 S Oxacillin                      R Penicillin                     R Rifampin                       S Tetracycline                   R Trimethoprim/Sulfa             S Vancomycin                     S   WBC, UA 08/04/2021 6-10 (A)  0 - 5 /hpf Final   RBC, Urine 08/04/2021 >30 (A)  0 - 2 /hpf Final   Epithelial Cells (non renal) 08/04/2021 0-10  0 - 10 /hpf Final   Casts 08/04/2021 Present (A)  None seen /lpf Final   Cast Type 08/04/2021 Hyaline casts  N/A Final   Bacteria, UA 08/04/2021 Moderate (A)  None seen/Few Final    ECG: Date: 08/10/2021 Rate: 69 bpm Rhythm: normal sinus Axis (leads I and aVF): Normal Intervals: PR 164 ms. QRS 100 ms. QTc 420 ms. ST segment and T wave changes: No evidence of acute ST segment elevation or depression. Nondiagnostic Q wave in lead III.  Comparison: Similar to previous tracing obtained on 10/18/2019   IMAGING / PROCEDURES: CT RENAL STONE STUDY performed on 07/30/2021 Single 11 mm stone within the RIGHT renal pelvis without hydronephrosis. Colonic diverticulosis without evidence of acute diverticulitis. Aortic and coronary artery atherosclerosis Prostatomegaly   LEFT HEART CATHETERIZATION WITH STAGED STENT INTERVENTION performed on 10/20/2019 Multivessel CAD 80% OM1 65% proximal RCA 50% mid RCA 99%  distal RCA Previously placed stents DES to mid LAD-1 widely patent DES to mid LAD-2 widely patent Successful PTCA/PCI PTCA of the proximal to mid RCA 2.75 x 16 mm Synergy XD DES x1 to the OM1 3.50 x 16 mm Synergy XD DES x1 to the distal RCA Recommendations: DAPT therapy (ASA + ticagrelor) x1 year Consider continuing DAPT for lifetime given his first generation DES in the RCA and now with multivessel CAD. Continue aggressive medical management of CAD using both statin and beta-blocker therapies   TRANSTHORACIC ECHOCARDIOGRAM performed on 10/19/2019 The apex is akinetic. Marland Kitchen Left ventricular ejection fraction, by estimation, is 40%. The left ventricle has mildly decreased function. The left ventricle demonstrates regional wall motion abnormalities There is moderate left ventricular hypertrophy. Left ventricular diastolic parameters were normal.  Right ventricular systolic function was not well visualized. The right ventricular size is not well visualized.  The mitral valve was not well visualized. No evidence of mitral valve regurgitation. No evidence of mitral stenosis.  The aortic valve was not well visualized. Aortic valve regurgitation is not visualized. No aortic stenosis is present.  Aortic dilatation noted. There is mild dilatation of the aortic root, measuring 41 mm.   LEFT HEART CATHETERIZATION, CORONARY ANGIOGRAPHY, AND ACUTE MI REVASCULARIZATION performed on 10/18/2019 Segmental left ventricular systolic dysfunction.  Hand-injection of contrast and unable to fully communal LVEF LVEDP = 18 mmHg Multivessel CAD 65% proximal RCA 50% mid RCA 99% distal RCA 80% OM1 70% mid LAD-1 100% mid LAD-2 Successful PCI yielding excellent angiographic result, 0% residual stenosis, and TIMI-3 flow 2.75 x 12 mm Synergy XD DES x1 to the mid LAD-1 lesion 2.75 x 24 mm Synergy XD DES x1 to the mid LAD-2 lesion Recommendations: Admit to ICU Aggrastat drip x 2 hours Continue DAPT therapy using  ASA and ticagrelor for at least 12 months Continue beta-blocker and start high intensity statin Echocardiogram in the morning Will need staged PCI of the RCA and the LCx/OM lesions.    Impression and Plan:  Jeremy [name] has been referred for pre-anesthesia review and clearance prior to him undergoing the planned anesthetic and procedural courses. Available labs, pertinent testing, and imaging results were personally reviewed by me. This patient has been appropriately cleared by cardiology with an overall ACCEPTABLE risk of significant perioperative cardiovascular complications.  Based on clinical review performed today (08/10/21), barring any significant acute changes in the patient's overall condition, it is anticipated that  he will be able to proceed with the planned surgical intervention. Any acute changes in clinical condition may necessitate his procedure being postponed and/or cancelled. Patient will meet with anesthesia team (MD and/or CRNA) on the day of his procedure for preoperative evaluation/assessment. Questions regarding anesthetic course will be fielded at that time.   Pre-surgical instructions were reviewed with the patient during his PAT appointment and questions were fielded by PAT clinical staff. Patient was advised that if any questions or concerns arise prior to his procedure then he should return a call to PAT and/or his surgeon's office to discuss.  Quentin Mulling, MSN, APRN, FNP-C, CEN [facility]  Peri-operative Services Nurse Practitioner Phone: [phone] Fax: [phone] 08/10/21 5:41 PM  NOTE: This note has been prepared using Dragon dictation software. Despite my best ability to proofread, there is always the potential that unintentional transcriptional errors may still occur from this process.

## 2021-08-10 NOTE — Patient Instructions (Addendum)
Your procedure is scheduled on:08-12-21 Friday Report to the Registration Desk on the 1st floor of the Medical [facility].Then proceed to the 2nd floor Surgery Desk To find out your arrival time, please call [phone] between 1PM - 3PM on:08-11-21 Thursday If your arrival time is 6:00 am, do not arrive prior to that time as the Medical Mall entrance doors do not open until 6:00 am.  REMEMBER: Instructions that are not followed completely may result in serious medical risk, up to and including death; or upon the discretion of your surgeon and anesthesiologist your surgery may need to be rescheduled.  Do not eat food OR drink any liquids after midnight the night before surgery.  No gum chewing, lozengers or hard candies.  TAKE THESE MEDICATIONS THE MORNING OF SURGERY WITH A SIP OF WATER: -metoprolol tartrate (LOPRESSOR)   Last dose of 81 mg Aspirin was on 07-31-21  One week prior to surgery: Stop Anti-inflammatories (NSAIDS) such as Advil, Aleve, Ibuprofen, Motrin, Naproxen, Naprosyn and Aspirin based products such as Excedrin, Goodys Powder, BC Powder.You may however, take Tylenol if needed for pain up until the day of surgery.  Stop ANY OVER THE COUNTER supplements/vitamins NOW (08-10-21) until after surgery.  No Alcohol for 24 hours before or after surgery.  No Smoking including e-cigarettes for 24 hours prior to surgery.  No chewable tobacco products for at least 6 hours prior to surgery.  No nicotine patches on the day of surgery.  Do not use any "recreational" drugs for at least a week prior to your surgery.  Please be advised that the combination of cocaine and anesthesia may have negative outcomes, up to and including death. If you test positive for cocaine, your surgery will be cancelled.  On the morning of surgery brush your teeth with toothpaste and water, you may rinse your mouth with mouthwash if you wish. Do not swallow any toothpaste or mouthwash.  Do not wear jewelry,  make-up, hairpins, clips or nail polish.  Do not wear lotions, powders, or perfumes.   Do not shave body from the neck down 48 hours prior to surgery just in case you cut yourself which could leave a site for infection.  Also, freshly shaved skin may become irritated if using the CHG soap.  Contact lenses, hearing aids and dentures may not be worn into surgery.  Do not bring valuables to the hospital. [facility] is not responsible for any missing/lost belongings or valuables.   Notify your doctor if there is any change in your medical condition (cold, fever, infection).  Wear comfortable clothing (specific to your surgery type) to the hospital.  After surgery, you can help prevent lung complications by doing breathing exercises.  Take deep breaths and cough every 1-2 hours. Your doctor may order a device called an Incentive Spirometer to help you take deep breaths. When coughing or sneezing, hold a pillow firmly against your incision with both hands. This is called "splinting." Doing this helps protect your incision. It also decreases belly discomfort.  If you are being admitted to the hospital overnight, leave your suitcase in the car. After surgery it may be brought to your room.  If you are being discharged the day of surgery, you will not be allowed to drive home. You will need a responsible adult (18 years or older) to drive you home and stay with you that night.   If you are taking public transportation, you will need to have a responsible adult (18 years or older) with  you. Please confirm with your physician that it is acceptable to use public transportation.   Please call the Pre-admissions Testing Dept. at [phone] if you have any questions about these instructions.  Surgery Visitation Policy:  Patients undergoing a surgery or procedure may have two family members or support persons with them as long as the person is not COVID-19 positive or experiencing its symptoms.

## 2021-08-12 ENCOUNTER — Encounter: Payer: Self-pay | Admitting: Urology

## 2021-08-12 ENCOUNTER — Other Ambulatory Visit: Payer: Self-pay

## 2021-08-12 ENCOUNTER — Ambulatory Visit
Admission: RE | Admit: 2021-08-12 | Discharge: 2021-08-12 | Disposition: A | Discharge status: Home / Self Care | Payer: PRIVATE HEALTH INSURANCE | Attending: Urology | Admitting: Urology

## 2021-08-12 ENCOUNTER — Ambulatory Visit: Payer: PRIVATE HEALTH INSURANCE | Admitting: Urgent Care

## 2021-08-12 ENCOUNTER — Ambulatory Visit: Payer: PRIVATE HEALTH INSURANCE

## 2021-08-12 ENCOUNTER — Encounter
Admission: RE | Disposition: A | Discharge status: Home / Self Care | Payer: Self-pay | Source: Home / Self Care | Attending: Urology

## 2021-08-12 DIAGNOSIS — Z955 Presence of coronary angioplasty implant and graft: Secondary | ICD-10-CM | POA: Insufficient documentation

## 2021-08-12 DIAGNOSIS — Z87891 Personal history of nicotine dependence: Secondary | ICD-10-CM | POA: Diagnosis not present

## 2021-08-12 DIAGNOSIS — I10 Essential (primary) hypertension: Secondary | ICD-10-CM | POA: Diagnosis not present

## 2021-08-12 DIAGNOSIS — I252 Old myocardial infarction: Secondary | ICD-10-CM | POA: Diagnosis not present

## 2021-08-12 DIAGNOSIS — E785 Hyperlipidemia, unspecified: Secondary | ICD-10-CM | POA: Insufficient documentation

## 2021-08-12 DIAGNOSIS — R310 Gross hematuria: Secondary | ICD-10-CM | POA: Insufficient documentation

## 2021-08-12 DIAGNOSIS — I255 Ischemic cardiomyopathy: Secondary | ICD-10-CM | POA: Diagnosis not present

## 2021-08-12 DIAGNOSIS — I25110 Atherosclerotic heart disease of native coronary artery with unstable angina pectoris: Secondary | ICD-10-CM

## 2021-08-12 DIAGNOSIS — N200 Calculus of kidney: Secondary | ICD-10-CM | POA: Insufficient documentation

## 2021-08-12 DIAGNOSIS — I2510 Atherosclerotic heart disease of native coronary artery without angina pectoris: Secondary | ICD-10-CM | POA: Insufficient documentation

## 2021-08-12 DIAGNOSIS — Z7982 Long term (current) use of aspirin: Secondary | ICD-10-CM | POA: Insufficient documentation

## 2021-08-12 DIAGNOSIS — N201 Calculus of ureter: Secondary | ICD-10-CM

## 2021-08-12 DIAGNOSIS — Z01812 Encounter for preprocedural laboratory examination: Secondary | ICD-10-CM

## 2021-08-12 HISTORY — PX: CYSTOSCOPY/URETEROSCOPY/HOLMIUM LASER/STENT PLACEMENT: SHX6546

## 2021-08-12 HISTORY — DX: Atherosclerosis of aorta: I70.0

## 2021-08-12 HISTORY — DX: Long term (current) use of antithrombotics/antiplatelets: Z79.02

## 2021-08-12 HISTORY — DX: Alcohol abuse, in remission: F10.11

## 2021-08-12 HISTORY — PX: CYSTOSCOPY W/ RETROGRADES: SHX1426

## 2021-08-12 HISTORY — DX: Diverticulosis of intestine, part unspecified, without perforation or abscess without bleeding: K57.90

## 2021-08-12 HISTORY — DX: Benign prostatic hyperplasia without lower urinary tract symptoms: N40.0

## 2021-08-12 SURGERY — CYSTOSCOPY, WITH RETROGRADE PYELOGRAM
Anesthesia: General | Laterality: Right

## 2021-08-12 MED ORDER — OXYCODONE HCL 5 MG PO TABS
5.0000 mg | ORAL_TABLET | Freq: Once | ORAL | Status: AC | PRN
  Administered 2021-08-12: 5 mg via ORAL

## 2021-08-12 MED ORDER — OXYCODONE HCL 5 MG/5ML PO SOLN: 5.0000 mg | Freq: Once | ORAL | Status: AC | PRN

## 2021-08-12 MED ORDER — KETOROLAC TROMETHAMINE 30 MG/ML IJ SOLN
INTRAMUSCULAR | Status: DC | PRN
  Administered 2021-08-12: 15 mg via INTRAVENOUS

## 2021-08-12 MED ORDER — ROCURONIUM BROMIDE 10 MG/ML (PF) SYRINGE
PREFILLED_SYRINGE | INTRAVENOUS | Status: AC
  Filled 2021-08-12: qty 10

## 2021-08-12 MED ORDER — ROCURONIUM BROMIDE 100 MG/10ML IV SOLN
INTRAVENOUS | Status: DC | PRN
  Administered 2021-08-12: 70 mg via INTRAVENOUS

## 2021-08-12 MED ORDER — HYDROCODONE-ACETAMINOPHEN 5-325 MG PO TABS: 1.0000 | ORAL_TABLET | ORAL | 0 refills | Status: AC | PRN

## 2021-08-12 MED ORDER — FENTANYL CITRATE (PF) 100 MCG/2ML IJ SOLN
INTRAMUSCULAR | Status: AC
  Filled 2021-08-12: qty 2

## 2021-08-12 MED ORDER — CHLORHEXIDINE GLUCONATE 0.12 % MT SOLN: 15.0000 mL | Freq: Once | OROMUCOSAL | Status: AC

## 2021-08-12 MED ORDER — ONDANSETRON HCL 4 MG/2ML IJ SOLN
INTRAMUSCULAR | Status: DC | PRN
  Administered 2021-08-12: 4 mg via INTRAVENOUS

## 2021-08-12 MED ORDER — PROPOFOL 10 MG/ML IV BOLUS
INTRAVENOUS | Status: DC | PRN
  Administered 2021-08-12: 150 mg via INTRAVENOUS

## 2021-08-12 MED ORDER — MIDAZOLAM HCL 2 MG/2ML IJ SOLN
INTRAMUSCULAR | Status: DC | PRN
  Administered 2021-08-12: 2 mg via INTRAVENOUS

## 2021-08-12 MED ORDER — CEFAZOLIN SODIUM-DEXTROSE 2-4 GM/100ML-% IV SOLN
2.0000 g | INTRAVENOUS | Status: AC
  Administered 2021-08-12: 2 g via INTRAVENOUS

## 2021-08-12 MED ORDER — LIDOCAINE HCL (PF) 2 % IJ SOLN
INTRAMUSCULAR | Status: AC
  Filled 2021-08-12: qty 5

## 2021-08-12 MED ORDER — OXYCODONE HCL 5 MG PO TABS
ORAL_TABLET | ORAL | Status: AC
  Filled 2021-08-12: qty 1

## 2021-08-12 MED ORDER — LIDOCAINE HCL (CARDIAC) PF 100 MG/5ML IV SOSY
PREFILLED_SYRINGE | INTRAVENOUS | Status: DC | PRN
  Administered 2021-08-12: 100 mg via INTRAVENOUS

## 2021-08-12 MED ORDER — ORAL CARE MOUTH RINSE
15.0000 mL | Freq: Once | OROMUCOSAL | Status: AC
Start: 2021-08-12 — End: 2021-08-12

## 2021-08-12 MED ORDER — ONDANSETRON HCL 4 MG/2ML IJ SOLN
INTRAMUSCULAR | Status: AC
  Filled 2021-08-12: qty 2

## 2021-08-12 MED ORDER — FENTANYL CITRATE (PF) 100 MCG/2ML IJ SOLN
INTRAMUSCULAR | Status: DC | PRN
  Administered 2021-08-12: 50 ug via INTRAVENOUS

## 2021-08-12 MED ORDER — FENTANYL CITRATE (PF) 100 MCG/2ML IJ SOLN: 25.0000 ug | INTRAMUSCULAR | Status: DC | PRN

## 2021-08-12 MED ORDER — FAMOTIDINE 20 MG PO TABS: 20.0000 mg | ORAL_TABLET | Freq: Once | ORAL | Status: AC

## 2021-08-12 MED ORDER — IOHEXOL 180 MG/ML  SOLN
INTRAMUSCULAR | Status: DC | PRN
  Administered 2021-08-12: 10 mL

## 2021-08-12 MED ORDER — LACTATED RINGERS IV SOLN: INTRAVENOUS | Status: DC

## 2021-08-12 MED ORDER — CHLORHEXIDINE GLUCONATE 0.12 % MT SOLN
OROMUCOSAL | Status: AC
  Administered 2021-08-12: 15 mL via OROMUCOSAL
  Filled 2021-08-12: qty 15

## 2021-08-12 MED ORDER — MIDAZOLAM HCL 2 MG/2ML IJ SOLN
INTRAMUSCULAR | Status: AC
  Filled 2021-08-12: qty 2

## 2021-08-12 MED ORDER — SUGAMMADEX SODIUM 200 MG/2ML IV SOLN
INTRAVENOUS | Status: DC | PRN
  Administered 2021-08-12: 300 mg via INTRAVENOUS

## 2021-08-12 MED ORDER — SULFAMETHOXAZOLE-TRIMETHOPRIM 800-160 MG PO TABS: 1.0000 | ORAL_TABLET | Freq: Once | ORAL | 0 refills | Status: DC | PRN

## 2021-08-12 MED ORDER — DEXAMETHASONE SODIUM PHOSPHATE 10 MG/ML IJ SOLN
INTRAMUSCULAR | Status: AC
Start: 2021-08-12 — End: ?
  Filled 2021-08-12: qty 1

## 2021-08-12 MED ORDER — DEXAMETHASONE SODIUM PHOSPHATE 10 MG/ML IJ SOLN
INTRAMUSCULAR | Status: DC | PRN
  Administered 2021-08-12: 8 mg via INTRAVENOUS

## 2021-08-12 MED ORDER — CEFAZOLIN SODIUM-DEXTROSE 2-4 GM/100ML-% IV SOLN
INTRAVENOUS | Status: AC
  Filled 2021-08-12: qty 100

## 2021-08-12 MED ORDER — FAMOTIDINE 20 MG PO TABS
ORAL_TABLET | ORAL | Status: AC
  Administered 2021-08-12: 20 mg via ORAL
  Filled 2021-08-12: qty 1

## 2021-08-12 SURGICAL SUPPLY — 47 items
ADH LQ OCL WTPRF AMP STRL LF (MISCELLANEOUS) ×3
ADHESIVE MASTISOL STRL (MISCELLANEOUS) ×2 IMPLANT
BAG DRAIN CYSTO-URO LG1000N (MISCELLANEOUS) ×4 IMPLANT
BAG DRN RND TRDRP ANRFLXCHMBR (UROLOGICAL SUPPLIES) ×3
BAG PRESSURE INF REUSE 3000 (BAG) ×2 IMPLANT
BAG URINE DRAIN 2000ML AR STRL (UROLOGICAL SUPPLIES) ×4 IMPLANT
BRUSH SCRUB EZ  4% CHG (MISCELLANEOUS) ×4
BRUSH SCRUB EZ 1% IODOPHOR (MISCELLANEOUS) ×4 IMPLANT
BRUSH SCRUB EZ 4% CHG (MISCELLANEOUS) ×3 IMPLANT
CATH FOL 2WAY LX 18X30 (CATHETERS) ×4 IMPLANT
CATH URET FLEX-TIP 2 LUMEN 10F (CATHETERS) ×2 IMPLANT
CATH URETL OPEN 5X70 (CATHETERS) ×2 IMPLANT
CNTNR SPEC 2.5X3XGRAD LEK (MISCELLANEOUS)
CONRAY 43 FOR UROLOGY 50M (MISCELLANEOUS) ×4 IMPLANT
CONT SPEC 4OZ STER OR WHT (MISCELLANEOUS)
CONT SPEC 4OZ STRL OR WHT (MISCELLANEOUS)
CONTAINER SPEC 2.5X3XGRAD LEK (MISCELLANEOUS) IMPLANT
DRAPE UTILITY 15X26 TOWEL STRL (DRAPES) ×4 IMPLANT
DRSG TEGADERM 2-3/8X2-3/4 SM (GAUZE/BANDAGES/DRESSINGS) ×2 IMPLANT
DRSG TELFA 4X3 1S NADH ST (GAUZE/BANDAGES/DRESSINGS) ×4 IMPLANT
ELECT REM PT RETURN 9FT ADLT (ELECTROSURGICAL) ×4
ELECTRODE REM PT RTRN 9FT ADLT (ELECTROSURGICAL) ×3 IMPLANT
FIBER LASER MOSES 200 DFL (Laser) ×2 IMPLANT
GAUZE 4X4 16PLY ~~LOC~~+RFID DBL (SPONGE) ×4 IMPLANT
GLOVE SURG UNDER POLY LF SZ7.5 (GLOVE) ×4 IMPLANT
GOWN STRL REUS W/ TWL LRG LVL3 (GOWN DISPOSABLE) ×3 IMPLANT
GOWN STRL REUS W/ TWL XL LVL3 (GOWN DISPOSABLE) ×3 IMPLANT
GOWN STRL REUS W/TWL LRG LVL3 (GOWN DISPOSABLE) ×4
GOWN STRL REUS W/TWL XL LVL3 (GOWN DISPOSABLE) ×4
GUIDEWIRE STR DUAL SENSOR (WIRE) ×6 IMPLANT
IV NS IRRIG 3000ML ARTHROMATIC (IV SOLUTION) ×4 IMPLANT
KIT TURNOVER CYSTO (KITS) ×4 IMPLANT
MANIFOLD NEPTUNE II (INSTRUMENTS) ×2 IMPLANT
PACK CYSTO AR (MISCELLANEOUS) ×4 IMPLANT
SET CYSTO W/LG BORE CLAMP LF (SET/KITS/TRAYS/PACK) ×4 IMPLANT
SHEATH NAVIGATOR HD 12/14X36 (SHEATH) IMPLANT
STENT URET 6FRX24 CONTOUR (STENTS) IMPLANT
STENT URET 6FRX26 CONTOUR (STENTS) ×2 IMPLANT
SURGILUBE 2OZ TUBE FLIPTOP (MISCELLANEOUS) ×4 IMPLANT
SYR 10ML LL (SYRINGE) ×4 IMPLANT
SYR TOOMEY IRRIG 70ML (MISCELLANEOUS) ×4
SYRINGE TOOMEY IRRIG 70ML (MISCELLANEOUS) ×3 IMPLANT
TRACTIP FLEXIVA PULSE ID 200 (Laser) IMPLANT
VALVE UROSEAL ADJ ENDO (VALVE) ×2 IMPLANT
WATER STERILE IRR 1000ML POUR (IV SOLUTION) ×4 IMPLANT
WATER STERILE IRR 3000ML UROMA (IV SOLUTION) ×2 IMPLANT
WATER STERILE IRR 500ML POUR (IV SOLUTION) ×4 IMPLANT

## 2021-08-12 NOTE — Op Note (Signed)
Date of procedure: 08/12/21  Preoperative diagnosis:  Right renal pelvis stone Gross hematuria  Postoperative diagnosis:  Same  Procedure: Cystoscopy, left retrograde pyelogram with intraoperative interpretation, right retrograde pyelogram with intraoperative interpretation Right ureteroscopy, laser lithotripsy, stent placement  Surgeon: Legrand Rams, MD  Anesthesia: General  Complications: None  Intraoperative findings:  Normal cystoscopy, ureteral orifices close to bladder neck, normal retrograde pyelograms bilaterally Uncomplicated dusting of 1 cm right renal pelvis stone Stent placed with Dangler  EBL: Minimal  Specimens: Stone for analysis  Drains: Right 6 French by 26 cm ureteral stent with Dangler  Indication: Jeremy [name] is a 61 y.o. patient with gross hematuria incidentally found to have a 1 cm right renal pelvis stone with some mild right-sided flank pain.  After reviewing the management options for treatment, they elected to proceed with the above surgical procedure(s). We have discussed the potential benefits and risks of the procedure, side effects of the proposed treatment, the likelihood of the patient achieving the goals of the procedure, and any potential problems that might occur during the procedure or recuperation. Informed consent has been obtained.  Description of procedure:  The patient was taken to the operating room and general anesthesia was induced. SCDs were placed for DVT prophylaxis. The patient was placed in the dorsal lithotomy position, prepped and draped in the usual sterile fashion, and preoperative antibiotics(Ancef) were administered. A preoperative time-out was performed.   A 21 French rigid cystoscope was used to intubate the urethra and a normal-appearing urethra was followed proximally to the bladder.  The prostate was moderate in size with a slightly elevated bladder neck, and the ureteral orifices were very close to the prostate.   Thorough cystoscopy showed no suspicious lesions.  A sensor wire was used to intubate the left ureteral orifice and a 5 French access catheter advanced over the wire into the distal ureter.  A retrograde pyelogram was performed on the left side which showed no hydronephrosis or filling defects.  A sensor wire was then advanced into the right ureteral orifice and passed up to the kidney under fluoroscopic vision.  A dual-lumen ureteral access catheter was advanced over the wire to the level of the stone, and a second safety wire was added.  The single channel digital flexible ureteroscope then advanced easily over the wire up to the kidney under fluoroscopic vision.  There was a 1 cm yellow and black stone that appeared to be calcium oxalate in the renal pelvis.  A 200 m laser fiber on settings of 0.5 J and 80 Hz was used to methodically dust the stone.  Thorough pyeloscopy at the conclusion of fragmentation revealed no significant stone fragments.  A retrograde pyelogram was performed from the proximal ureter and showed no extravasation or filling defects.  Careful pullback ureteroscopy showed no residual stones or ureteral injury.  The rigid cystoscope was backloaded over the wire and a 6 [place] by 26 cm ureteral stent was uneventfully placed with an excellent curl in the upper pole, as well as in the bladder under direct vision.  The bladder was drained.  The Dangler was secured to the phallus using Mastisol and Tegaderm.  Disposition: Stable to PACU  Plan: Remove stent at home on Wednesday 7/19 Follow-up in urology clinic in 6 months with KUB prior, follow-up stone analysis  Legrand Rams, MD

## 2021-08-12 NOTE — Anesthesia Preprocedure Evaluation (Signed)
Anesthesia Evaluation  Patient identified by MRN, date of birth, ID band Patient awake    Reviewed: Allergy & Precautions, NPO status , Patient's Chart, lab work & pertinent test results  Airway Mallampati: III  TM Distance: >3 FB Neck ROM: full    Dental  (+) Teeth Intact   Pulmonary neg pulmonary ROS, former smoker,    Pulmonary exam normal        Cardiovascular hypertension, + CAD and + Past MI  Normal cardiovascular exam     Neuro/Psych negative neurological ROS  negative psych ROS   GI/Hepatic negative GI ROS, Neg liver ROS,   Endo/Other  negative endocrine ROS  Renal/GU      Musculoskeletal   Abdominal   Peds  Hematology negative hematology ROS (+)   Anesthesia Other Findings Patient with PMH of MI, last in 2021. Patient stated he felt like he had extra beats this morning. Denies chest pain, or any feeling like his last MI. 12 lead EKG ordered and reviewed. EKG similar to previous. Discussed with patient and agreed to proceed with surgery.    Past Medical History: 10/18/2019: Acute ST elevation myocardial infarction (STEMI) of  anterior wall (HCC)     Comment:  a.) LHC/PCI 10/18/2019: 65% ISR pRCA, 50% ISR mRCA, 99%               dRCA, 80% OM1, 70% mLAD-1 (2.75 x 12 mm Synergy DES),               100% mLAD-2 (2.75 x 44mm Synergy DES); b.) staged PCI:               PTCA p-mRCA, 2.75 x 69mm Synergy DES OM1, 3.50 x 71mm               Synergy DES dRCA 12/18/2006: Acute ST elevation myocardial infarction (STEMI) of  inferoposterior wall [facility]     Comment:  a.) LHC/PCI 12/18/2006: 30-35% mLAD, 60-70% pLCx, 40-50%              mLCx, 20-25% pOM1, 100% pRCA (overlapping 3.5 x 56mm               Cypher DES pRCA and mRCA) No date: Aortic atherosclerosis (HCC) 12/18/2006: Coronary artery disease     Comment:  a.) Inferoposterior STEMI -> LHC/PCI: 30-35% mLAD,               60-70% pLCx, 40-50% mLCx, 20-25% pOM1,  100% pRCA               (overlapping 3.5x71mm Cypher DES pRCA and mRCA); b.)               anterior STEMI 10/18/2019: LHC/PCI: 65% ISR pRCA, 50% ISR              mRCA, 99% dRCA, 80% OM1, 70% mLAD-1 (2.75x12 mm Synergy               DES), 100% mLAD-2 (2.75x34mm Synergy DES); c.) staged               PCI: PTCA p-mRCA, 2.75x90mm Synergy DES OM1, 3.50x3mm               Synergy DES dRCA 10/19/2019: Dilated aortic root (HCC)     Comment:  a.) TTE 10/19/2019: Ao root measured 41 mm No date: Diverticulosis No date: Enlarged prostate without urinary obstruction No date: Former tobacco use No date: History of cocaine abuse (HCC) No date: History of ETOH abuse No  date: History of kidney stones No date: Hyperlipidemia No date: Hypertension No date: Ischemic cardiomyopathy No date: Long term current use of antithrombotics/antiplatelets     Comment:  a.) daily ASA; ticagrelor d/c'd 07/29/2021 12/18/2006: Ventricular fibrillation (HCC)     Comment:  a.) single episode x 1 in setting of inferoposterior               STEMI; defib x 1 restoring NSR; Tx'd with amiodarone.  Past Surgical History: 12/18/2006: CORONARY ANGIOPLASTY WITH STENT PLACEMENT     Comment:  Procedure: CORONARY ANGIOPLASTY WITH STENT PLACEMENT;               Location: [facility]; Surgeon: Rinaldo Cloud, MD 10/20/2019: CORONARY STENT INTERVENTION; N/A     Comment:  Procedure: CORONARY STENT INTERVENTION;  Surgeon:               Kathleene Hazel, MD;  Location: [facility] INVASIVE CV               LAB;  Service: Cardiovascular;  Laterality: N/A; 10/18/2019: CORONARY/GRAFT ACUTE MI REVASCULARIZATION; N/A     Comment:  Procedure: Coronary/Graft Acute MI Revascularization;                Surgeon: Kathleene Hazel, MD;  Location: [facility]               INVASIVE CV LAB;  Service: Cardiovascular;  Laterality:               N/A; No date: INGUINAL HERNIA REPAIR; Bilateral 10/18/2019: LEFT HEART CATH AND CORONARY ANGIOGRAPHY; N/A      Comment:  Procedure: LEFT HEART CATH AND CORONARY ANGIOGRAPHY;                Surgeon: Kathleene Hazel, MD;  Location: [facility]               INVASIVE CV LAB;  Service: Cardiovascular;  Laterality:               N/A; 11/13/2011: URETEROSCOPY     Comment:  Procedure: URETEROSCOPY;  Surgeon: Garnett Farm, MD;                Location: [facility];  Service: Urology;               Laterality: Right  BMI    Body Mass Index: 25.17 kg/m      Reproductive/Obstetrics negative OB ROS                             Anesthesia Physical Anesthesia Plan  ASA: 3  Anesthesia Plan: General ETT   Post-op Pain Management:    Induction: Intravenous  PONV Risk Score and Plan: 1 and Ondansetron, Dexamethasone, Midazolam and Treatment may vary due to age or medical condition  Airway Management Planned: Oral ETT  Additional Equipment:   Intra-op Plan:   Post-operative Plan: Extubation in OR  Informed Consent: I have reviewed the patients History and Physical, chart, labs and discussed the procedure including the risks, benefits and alternatives for the proposed anesthesia with the patient or authorized representative who has indicated his/her understanding and acceptance.     Dental Advisory Given  Plan Discussed with: Anesthesiologist, CRNA and Surgeon  Anesthesia Plan Comments: (Patient consented for risks of anesthesia including but not limited to:  - adverse reactions to medications - damage to eyes, teeth, lips or other oral mucosa - nerve damage  due to positioning  - sore throat or hoarseness - Damage to heart, brain, nerves, lungs, other parts of body or loss of life  Patient voiced understanding.)        Anesthesia Quick Evaluation

## 2021-08-12 NOTE — Transfer of Care (Signed)
Immediate Anesthesia Transfer of Care Note  Patient: Jeremy [name]  Procedure(s) Performed: CYSTOSCOPY WITH RETROGRADE PYELOGRAM (Bilateral) CYSTOSCOPY/URETEROSCOPY/HOLMIUM LASER/STENT PLACEMENT (Right)  Patient Location: PACU  Anesthesia Type:General  Level of Consciousness: awake, alert  and oriented  Airway & Oxygen Therapy: Patient Spontanous Breathing and Patient connected to face mask oxygen  Post-op Assessment: Report given to RN and Post -op Vital signs reviewed and stable  Post vital signs: Reviewed and stable  Last Vitals:  Vitals Value Taken Time  BP    Temp    Pulse    Resp    SpO2      Last Pain:  Vitals:   08/12/21 0935  TempSrc: Oral  PainSc: 0-No pain         Complications: No notable events documented.

## 2021-08-12 NOTE — Anesthesia Procedure Notes (Signed)
Procedure Name: Intubation Date/Time: 08/12/2021 11:54 AM  Performed by: Irving Burton, CRNAPre-anesthesia Checklist: Patient identified, Patient being monitored, Timeout performed, Emergency Drugs available and Suction available Patient Re-evaluated:Patient Re-evaluated prior to induction Oxygen Delivery Method: Circle system utilized Preoxygenation: Pre-oxygenation with 100% oxygen Induction Type: IV induction Ventilation: Mask ventilation without difficulty Laryngoscope Size: 3 and McGraph Grade View: Grade I Tube type: Oral Tube size: 7.5 mm Number of attempts: 1 Airway Equipment and Method: Stylet and Video-laryngoscopy Placement Confirmation: ETT inserted through vocal cords under direct vision, positive ETCO2 and breath sounds checked- equal and bilateral Secured at: 23 cm Tube secured with: Tape Dental Injury: Teeth and Oropharynx as per pre-operative assessment

## 2021-08-12 NOTE — Interval H&P Note (Signed)
UROLOGY H&P UPDATE  Agree with prior H&P dated 08/04/2021  Cardiac: RRR Lungs: CTA bilaterally  Laterality: Bilateral retrograde pyelograms, right ureteroscopy/laser/stent Procedure: Cystoscopy, bilateral retrograde pyelograms, right ureteroscopy, laser lithotripsy, stent placement, possible bladder biopsy and fulguration  Urine: 200 colonies staph, likely contaminant, but treated with Bactrim DS twice daily x3 days  We specifically discussed the risks ureteroscopy including bleeding, infection/sepsis, stent related symptoms including flank pain/urgency/frequency/incontinence/dysuria, ureteral injury, inability to access stone, or need for staged or additional procedures.   Sondra Come, MD 08/12/2021

## 2021-08-12 NOTE — Anesthesia Postprocedure Evaluation (Signed)
Anesthesia Post Note  Patient: JAG LENZ  Procedure(s) Performed: CYSTOSCOPY WITH RETROGRADE PYELOGRAM (Bilateral) CYSTOSCOPY/URETEROSCOPY/HOLMIUM LASER/STENT PLACEMENT (Right)  Patient location during evaluation: PACU Anesthesia Type: General Level of consciousness: awake and alert Pain management: pain level controlled Vital Signs Assessment: post-procedure vital signs reviewed and stable Respiratory status: spontaneous breathing, nonlabored ventilation, respiratory function stable and patient connected to nasal cannula oxygen Cardiovascular status: blood pressure returned to baseline and stable Postop Assessment: no apparent nausea or vomiting Anesthetic complications: no   No notable events documented.   Last Vitals:  Vitals:   08/12/21 0935 08/12/21 1246  BP: 138/87 112/65  Pulse: 60 68  Resp:  16  Temp: (!) 35.8 C (!) 36.2 C  SpO2: 97% 97%    Last Pain:  Vitals:   08/12/21 1246  TempSrc:   PainSc: Asleep                 Stephanie Coup

## 2021-08-12 NOTE — Discharge Instructions (Signed)

## 2021-08-17 ENCOUNTER — Ambulatory Visit (INDEPENDENT_AMBULATORY_CARE_PROVIDER_SITE_OTHER): Payer: No Typology Code available for payment source | Admitting: Urology

## 2021-08-17 DIAGNOSIS — N201 Calculus of ureter: Secondary | ICD-10-CM

## 2021-08-17 NOTE — Progress Notes (Signed)
Patient presents today to have a stent on a string removed. Patient attempted to pull it himself and was unable to do it. I pulled the string with no problems. Patient tolerated well.

## 2021-08-23 LAB — CALCULI, WITH PHOTOGRAPH (CLINICAL LAB)
Calcium Oxalate Monohydrate: 20 %
Hydroxyapatite: 80 %
Weight Calculi: 13 mg

## 2022-02-16 ENCOUNTER — Ambulatory Visit: Payer: No Typology Code available for payment source | Admitting: Urology

## 2022-02-27 ENCOUNTER — Ambulatory Visit: Payer: No Typology Code available for payment source | Admitting: Urology

## 2022-03-02 ENCOUNTER — Ambulatory Visit
Admission: RE | Admit: 2022-03-02 | Discharge: 2022-03-02 | Disposition: A | Discharge status: Home / Self Care | Payer: PRIVATE HEALTH INSURANCE | Source: Ambulatory Visit | Attending: Urology | Admitting: Urology

## 2022-03-02 ENCOUNTER — Encounter: Payer: Self-pay | Admitting: Urology

## 2022-03-02 ENCOUNTER — Ambulatory Visit: Payer: No Typology Code available for payment source | Admitting: Urology

## 2022-03-02 ENCOUNTER — Other Ambulatory Visit: Payer: Self-pay

## 2022-03-02 ENCOUNTER — Ambulatory Visit
Admission: RE | Admit: 2022-03-02 | Discharge: 2022-03-02 | Disposition: A | Discharge status: Home / Self Care | Payer: PRIVATE HEALTH INSURANCE | Attending: Urology | Admitting: Urology

## 2022-03-02 VITALS — BP 125/87 | HR 116 | Ht 68.0 in | Wt 169.0 lb

## 2022-03-02 DIAGNOSIS — N200 Calculus of kidney: Secondary | ICD-10-CM

## 2022-03-02 DIAGNOSIS — Z87442 Personal history of urinary calculi: Secondary | ICD-10-CM

## 2022-03-02 NOTE — Progress Notes (Signed)
   03/02/2022 2:05 PM   Jeremy [name] Dec 28, 1960 [id number]  Reason for visit: Follow up nephrolithiasis  HPI: 62 year old male who presented with gross hematuria and right-sided flank pain and underwent right ureteroscopy, laser lithotripsy and stent placement on 08/12/2021 for a 1 cm right renal pelvis stone.  He removed his stent at home 1 week later.  He denies any problems since that time, specifically no gross hematuria or stone events.  He has some mild urinary frequency during the day likely secondary to diet Coke intake, bladder irritants were reviewed.  I personally viewed and interpreted his KUB today that shows no evidence of recurrent nephrolithiasis.  Stone analysis showed calcium phosphate. We discussed general stone prevention strategies including adequate hydration with goal of producing 2.5 L of urine daily, increasing citric acid intake, increasing calcium intake during high oxalate meals, minimizing animal protein, and decreasing salt intake. Information about dietary recommendations given today.   Follow-up with urology as needed  [place], [place] [address] [place], [place] [address] [phone]

## 2022-03-02 NOTE — Patient Instructions (Signed)

## 2023-08-13 ENCOUNTER — Encounter (HOSPITAL_BASED_OUTPATIENT_CLINIC_OR_DEPARTMENT_OTHER): Payer: Self-pay

## 2023-08-13 ENCOUNTER — Emergency Department (HOSPITAL_BASED_OUTPATIENT_CLINIC_OR_DEPARTMENT_OTHER): Admission: EM | Admit: 2023-08-13 | Discharge: 2023-08-13 | Disposition: A | Discharge status: Home / Self Care

## 2023-08-13 ENCOUNTER — Other Ambulatory Visit: Payer: Self-pay

## 2023-08-13 ENCOUNTER — Emergency Department (HOSPITAL_BASED_OUTPATIENT_CLINIC_OR_DEPARTMENT_OTHER): Admitting: Radiology

## 2023-08-13 DIAGNOSIS — Z7982 Long term (current) use of aspirin: Secondary | ICD-10-CM | POA: Insufficient documentation

## 2023-08-13 DIAGNOSIS — J069 Acute upper respiratory infection, unspecified: Secondary | ICD-10-CM | POA: Insufficient documentation

## 2023-08-13 DIAGNOSIS — Z79899 Other long term (current) drug therapy: Secondary | ICD-10-CM | POA: Insufficient documentation

## 2023-08-13 DIAGNOSIS — I10 Essential (primary) hypertension: Secondary | ICD-10-CM | POA: Diagnosis not present

## 2023-08-13 DIAGNOSIS — R059 Cough, unspecified: Secondary | ICD-10-CM | POA: Diagnosis present

## 2023-08-13 DIAGNOSIS — Z20822 Contact with and (suspected) exposure to covid-19: Secondary | ICD-10-CM | POA: Diagnosis not present

## 2023-08-13 LAB — RESP PANEL BY RT-PCR (RSV, FLU A&B, COVID)  RVPGX2
Influenza A by PCR: NEGATIVE
Influenza B by PCR: NEGATIVE
Resp Syncytial Virus by PCR: NEGATIVE
SARS Coronavirus 2 by RT PCR: NEGATIVE

## 2023-08-13 NOTE — ED Provider Notes (Signed)
 [place] EMERGENCY DEPARTMENT AT [facility] Provider Note   CSN: [id number] Arrival date & time: 08/13/23  1949     Patient presents with: Cough   Jeremy [name] is a 63 y.o. male with past medical history significant for previous STEMI, hyperlipidemia, hypertension, alcohol abuse who presents concern for cough, congestion, runny nose since last night.  Does report that he was exposed to someone who had COVID around a week ago.  Denies any fever at home.  Denies any chills, denies any shortness of breath, denies chest pain.  No diarrhea, abdominal pain.  Has not taken anything for symptoms at home so far.    Cough      Prior to Admission medications   Medication Sig Start Date End Date Taking? Authorizing Provider  aspirin  EC 81 MG EC tablet Take 1 tablet (81 mg total) by mouth daily. Swallow whole. 10/22/19   Henry Manuelita NOVAK, NP  atorvastatin  (LIPITOR ) 80 MG tablet Take 1 tablet (80 mg total) by mouth daily. Patient taking differently: Take 80 mg by mouth at bedtime. 10/22/19   Henry Manuelita NOVAK, NP  clopidogrel (PLAVIX) 75 MG tablet Take 75 mg by mouth 2 (two) times daily. Pt not unsure of dose    [provider]  metoprolol  tartrate (LOPRESSOR ) 25 MG tablet Take 12.5 mg by mouth 2 (two) times daily. 07/16/21   [provider]  nitroGLYCERIN  (NITROSTAT ) 0.4 MG SL tablet Place 1 tablet (0.4 mg total) under the tongue every 5 (five) minutes as needed for chest pain. 10/21/19 08/10/21  Henry Manuelita NOVAK, NP  sulfamethoxazole -trimethoprim  (BACTRIM  DS) 800-160 MG tablet Take 1 tablet by mouth every 12 (twelve) hours. 08/09/21   Francisca Redell BROCKS, MD  sulfamethoxazole -trimethoprim  (BACTRIM  DS) 800-160 MG tablet Take 1 tablet by mouth once as needed for up to 1 dose (take 30 minutes prior to stent removal). 08/12/21   Francisca Redell BROCKS, MD    Allergies: Patient has no known allergies.    Review of Systems  Respiratory:  Positive for cough.   All other  systems reviewed and are negative.   Updated Vital Signs BP 131/69 (BP Location: Right Arm)   Pulse 72   Temp 98.2 F (36.8 C) (Oral)   Resp 17   SpO2 96%   Physical Exam Vitals and nursing note reviewed.  Constitutional:      General: He is not in acute distress.    Appearance: Normal appearance.  HENT:     Head: Normocephalic and atraumatic.     Mouth/Throat:     Comments: No significant posterior oropharynx erythema, swelling, exudate. Uvula midline, tonsils 1+ bilaterally.  No trismus, stridor, evidence of PTA, floor of mouth swelling or redness.   Eyes:     General:        Right eye: No discharge.        Left eye: No discharge.  Cardiovascular:     Rate and Rhythm: Normal rate and regular rhythm.  Pulmonary:     Effort: Pulmonary effort is normal. No respiratory distress.     Comments: No wheezing, rhonchi, stridor, rales Musculoskeletal:        General: No deformity.  Skin:    General: Skin is warm and dry.  Neurological:     Mental Status: He is alert and oriented to person, place, and time.  Psychiatric:        Mood and Affect: Mood normal.        Behavior: Behavior normal.     (  all labs ordered are listed, but only abnormal results are displayed) Labs Reviewed  RESP PANEL BY RT-PCR (RSV, FLU A&B, COVID)  [id number]    EKG: None  Radiology: DG Chest 2 View Result Date: 08/13/2023 CLINICAL DATA:  Cough EXAM: CHEST - 2 VIEW COMPARISON:  None Available. FINDINGS: The heart size and mediastinal contours are within normal limits. Both lungs are clear. The visualized skeletal structures are unremarkable. IMPRESSION: No active cardiopulmonary disease. Electronically Signed   By: Dorethia Molt M.D.   On: 08/13/2023 20:13     Procedures   Medications Ordered in the ED - No data to display                                  Medical Decision Making Amount and/or Complexity of Data Reviewed Radiology: ordered.   This is a well-appearing 63yo male who  presents with concern for 2 days of cough, sore throat, congestion.  My emergent differential diagnosis includes acute upper respiratory infection with COVID, flu, RSV versus new asthma presentation, acute bronchitis, less clinical concern for pneumonia.  Also considered other ENT emergencies, Ludwig angina, strep pharyngitis, mono, versus epiglottis, tonsillitis versus other.  This is not an exhaustive differential.  On my exam patient is overall well-appearing, they have temperature of 98.2, breathing unlabored, no tachypnea, no respiratory distress, stable oxygen saturation.  Patient without tachycardia.  Bilateral TMs are clear.  RVP independently reviewed by myself shows negative for COVID, flu, RSV.  Patient symptoms are consistent with viral upper respiratory infection of unclear source, discussed with the patient given his COVID exposure to his possible that he does have COVID and it is just not showing up on test yet but also possible to be some other viral upper respiratory infection.  I independently interpreted imaging including plain film chest xray which shows no evidence of acute intrathoracic abnormality. I agree with the radiologist interpretation. Given that he is not hypoxic, normal appearance of posterior oropharynx, otherwise well-appearing no additional recommendations at this time.  Encouraged ibuprofen, Tylenol , rest, plenty of fluids.  Discussed extensive return precautions.  Patient discharged in stable condition at this time.   Final diagnoses:  URI with cough and congestion    ED Discharge Orders     None          Rosan Sherlean DEL, PA-C 08/13/23 2145    Neysa Caron PARAS, DO 08/13/23 2337

## 2023-08-13 NOTE — ED Triage Notes (Signed)
 Pt presents via POV c/o cough, congestion, and runny nose since last pm.

## 2023-08-13 NOTE — Discharge Instructions (Signed)
 Use over-the-counter cough and cold medications as needed, please return if you have fever that does not respond to Motrin, Tylenol , rest, severe shortness of breath, difficulty swallowing.  Please make sure that you are drinking plenty of fluids,

## 2023-08-29 ENCOUNTER — Other Ambulatory Visit (HOSPITAL_COMMUNITY): Payer: Self-pay | Admitting: Cardiology

## 2023-08-29 ENCOUNTER — Encounter (HOSPITAL_COMMUNITY): Payer: Self-pay | Admitting: Cardiology

## 2023-08-29 DIAGNOSIS — R079 Chest pain, unspecified: Secondary | ICD-10-CM

## 2023-09-05 ENCOUNTER — Encounter (HOSPITAL_COMMUNITY): Payer: Self-pay

## 2023-09-05 ENCOUNTER — Ambulatory Visit (HOSPITAL_COMMUNITY)
Admission: RE | Admit: 2023-09-05 | Discharge: 2023-09-05 | Disposition: A | Discharge status: Home / Self Care | Source: Ambulatory Visit | Attending: Cardiology | Admitting: Cardiology

## 2023-09-05 DIAGNOSIS — R079 Chest pain, unspecified: Secondary | ICD-10-CM | POA: Insufficient documentation

## 2023-09-05 MED ORDER — TECHNETIUM TC 99M TETROFOSMIN IV KIT
10.0000 | PACK | Freq: Once | INTRAVENOUS | Status: AC | PRN
  Administered 2023-09-05: 10.4 via INTRAVENOUS

## 2023-09-05 MED ORDER — TECHNETIUM TC 99M TETROFOSMIN IV KIT
30.0000 | PACK | Freq: Once | INTRAVENOUS | Status: AC | PRN
  Administered 2023-09-05: 30 via INTRAVENOUS

## 2023-10-02 ENCOUNTER — Ambulatory Visit: Payer: Self-pay

## 2023-10-02 ENCOUNTER — Encounter: Payer: Self-pay | Admitting: Family Medicine

## 2023-10-02 ENCOUNTER — Ambulatory Visit (INDEPENDENT_AMBULATORY_CARE_PROVIDER_SITE_OTHER): Admitting: Family Medicine

## 2023-10-02 VITALS — BP 121/75 | HR 84 | Resp 16 | Ht 68.0 in | Wt 174.0 lb

## 2023-10-02 DIAGNOSIS — M25521 Pain in right elbow: Secondary | ICD-10-CM

## 2023-10-02 NOTE — Patient Instructions (Signed)
 MyChart:  For all urgent or time sensitive needs we ask that you please call the office to avoid delays. Our number is [phone]. MyChart is not constantly monitored and due to the large volume of messages a day, replies may take up to 72 business hours.   MyChart Policy: MyChart allows for you to see your visit notes, after visit summary, provider recommendations, lab and tests results, make an appointment, request refills, and contact your provider or the office for non-urgent questions or concerns. Providers are seeing patients during normal business hours and do not have built in time to review MyChart messages.  We ask that you allow a minimum of 3 business days for responses to [employer]. For this reason, please do not send urgent requests through MyChart. Please call the office at [phone]. New and ongoing conditions may require a visit. We have virtual and in person visit available for your convenience.  Complex MyChart concerns may require a visit. Your provider may request you schedule a virtual or in person visit to ensure we are providing the best care possible. MyChart messages sent after 11:00 AM on Friday will not be received by the provider until Monday morning.    Lab and Test Results: You will receive your lab and test results on MyChart as soon as they are completed and results have been sent by the lab or testing facility. Due to this service, you will receive your results BEFORE your provider.  I review lab and tests results each morning prior to seeing patients. Some results require collaboration with other providers to ensure you are receiving the most appropriate care. For this reason, we ask that you please allow a minimum of 3-5 business days from the time the ALL results have been received for your provider to receive and review lab and test results and contact you about these.  Most lab and test result comments from the provider will be sent through MyChart.  Your provider may recommend changes to the plan of care, follow-up visits, repeat testing, ask questions, or request an office visit to discuss these results. You may reply directly to this message or call the office at [phone] to provide information for the provider or set up an appointment. In some instances, you will be called with test results and recommendations. Please let us  know if this is preferred and we will make note of this in your chart to provide this for you.    If you have not heard a response to your lab or test results in 5 business days from all results returning to MyChart, please call the office to let us  know. We ask that you please avoid calling prior to this time unless there is an emergent concern. Due to high call volumes, this can delay the resulting process.   After Hours: For all non-emergency after hours needs, please call the office at [phone] and select the option to reach the on-call provider service. On-call services are shared between multiple [place] offices and therefore it will not be possible to speak directly with your provider. On-call providers may provide medical advice and recommendations, but are unable to provide refills for maintenance medications.  For all emergency or urgent medical needs after normal business hours, we recommend that you seek care at the closest Urgent Care or Emergency Department to ensure appropriate treatment in a timely manner.  [facility] [place] has a 24 hour emergency room located on the ground floor for your  convenience.    Urgent Concerns During the Business Day Providers are seeing patients from 8AM to 5PM, Monday through Thursday, and 8AM to 12PM on Friday with a busy schedule and are most often not able to respond to non-urgent calls until the end of the day or the next business day. If you should have URGENT concerns during the day, please call and speak to the nurse or schedule a same day  appointment so that we can address your concern without delay.    Thank you, again, for choosing me as your health care partner. I appreciate your trust and look forward to learning more about you.    Evalene Arts, FNP-C

## 2023-10-02 NOTE — Telephone Encounter (Signed)
 FYI Only or Action Required?: Action required by provider: request for appointment.  Patient was last seen in primary care on new pcp.  Called Nurse Triage reporting Elbow Pain.  Symptoms began yesterday.  Interventions attempted: Nothing.  Symptoms are: unchanged.  Triage Disposition: See HCP Within 4 Hours (Or PCP Triage)  Patient/caregiver understands and will follow disposition?: YesCopied from CRM #[id number]. Topic: Clinical - Red Word Triage >> Oct 02, 2023  1:37 PM [name] wrote: [facility] that prompted transfer to Nurse Triage: Patient states he was playing golf on this weekend and yesterday he started having pain in his right elbow, thinks it may be dislocated.  Wants to establish at [facility]. Reason for Disposition  [1] SEVERE pain AND [2] not improved 2 hours after pain medicine  Answer Assessment - Initial Assessment Questions Can't straighten out without intense pain. Pt thinks when he swung club he clipped ground and it jarred arm.      1. ONSET: When did the pain start?     yesterday 2. LOCATION: Where is the pain located?     Right arm-forearm to bicep 3. PAIN: How bad is the pain? (Scale 1-10; or mild, moderate, severe)     3-8 depending on movement  4. WORK OR EXERCISE: Has there been any recent work or exercise that involved this part of the body?     Playing golf 5. CAUSE: What do you think is causing the elbow pain?     Not sure 6. OTHER SYMPTOMS: Do you have any other symptoms? (e.g., neck pain, elbow swelling, rash, fever) Swelling in right arm at forearm area  Protocols used: Elbow Pain-A-AH

## 2023-10-02 NOTE — Progress Notes (Signed)
   Established Patient Office Visit  Subjective  Patient ID: Jeremy [name], male    DOB: 06/21/1960  Age: 63 y.o. MRN: [id number]  Chief Complaint  Patient presents with   Elbow Injury    Hurt arm playing golf-Right elbow pain. Will be seen for Elbow only PCP in [place]    Discussed the use of AI scribe software for clinical note transcription with the patient, who gave verbal consent to proceed.  History of Present Illness    Jeremy [name] is a 63 year old male who presents with a right elbow injury sustained while playing golf.  He sustained the injury to his right elbow yesterday while playing golf when he hit into something, resulting in immediate pain. Despite the pain, he continued to play due to his brother being in town. Since the injury, he has noticed increasing bruising and swelling in the elbow area.  He has been using ice and taking Advil to manage the symptoms, with ice applied last night and Advil taken today. The bruising has been progressively worsening, described as 'getting bigger and bigger' since the injury.  The pain is localized primarily to the elbow, with increased discomfort when extending the arm. Holding the arm in a bent position alleviates some of the pain, although discomfort persists. No popping sensation was noted at the time of injury.  He is employed in a warehouse and performs various tasks. He mentions that he can delegate tasks to others if necessary.      ROS: see HPI     Objective:    BP 121/75   Pulse 84   Resp 16   Ht 5' 8 (1.727 m)   Wt 174 lb (78.9 kg)   SpO2 98%   BMI 26.46 kg/m  BP Readings from Last 3 Encounters:  10/02/23 121/75  09/05/23 114/60  08/13/23 131/69     Physical Exam Vitals reviewed.  Constitutional:      Appearance: Normal appearance.  Cardiovascular:     Rate and Rhythm: Normal rate and regular rhythm.     Pulses: Normal pulses.     Heart sounds: Normal heart sounds.  Pulmonary:     Effort:  Pulmonary effort is normal.     Breath sounds: Normal breath sounds.  Musculoskeletal:     Right elbow: Swelling present. Decreased range of motion. Tenderness present in lateral epicondyle.     Left elbow: Normal.  Neurological:     Mental Status: He is alert.  Psychiatric:        Mood and Affect: Mood normal.        Behavior: Behavior normal.     Assessment & Plan:   1. Right elbow pain (Primary) Right elbow injury with possible tendon tear or rupture. Acute injury with bruising, swelling, and pain on extension. Differential includes tendon rupture or tear. Imaging needed for severity assessment. Order urgent imaging of the right elbow. Prescribe anti-inflammatory medication and muscle relaxer. Advise continued icing of the elbow. Provide work note restricting from lifting duties.  - DG Elbow Complete Right; Future  Return f/u with your PCP.    Evalene Arts, FNP

## 2023-10-03 ENCOUNTER — Ambulatory Visit
Admission: RE | Admit: 2023-10-03 | Discharge: 2023-10-03 | Disposition: A | Discharge status: Home / Self Care | Source: Ambulatory Visit | Attending: Family Medicine | Admitting: Family Medicine

## 2023-10-03 ENCOUNTER — Telehealth: Payer: Self-pay | Admitting: Family Medicine

## 2023-10-03 ENCOUNTER — Ambulatory Visit
Admission: RE | Admit: 2023-10-03 | Discharge: 2023-10-03 | Disposition: A | Discharge status: Home / Self Care | Attending: Family Medicine | Admitting: Family Medicine

## 2023-10-03 DIAGNOSIS — M25521 Pain in right elbow: Secondary | ICD-10-CM

## 2023-10-03 NOTE — Telephone Encounter (Signed)
 Copied from CRM [id number]. Topic: Clinical - Medication Question >> Oct 03, 2023 10:45 AM Debby BROCKS wrote: Reason for CRM: Patient was seen for an elbow issue on Sept 2 - [facility] Evalene Arts, FNP  He states that Evalene was going to look into getting the patient muscle reactors prescribed. He wants to know if that was done or if it can be done to help him through the work week

## 2023-10-04 ENCOUNTER — Telehealth: Payer: Self-pay

## 2023-10-04 ENCOUNTER — Other Ambulatory Visit: Payer: Self-pay | Admitting: Family Medicine

## 2023-10-04 ENCOUNTER — Ambulatory Visit: Payer: Self-pay | Admitting: Family Medicine

## 2023-10-04 MED ORDER — METHOCARBAMOL 500 MG PO TABS: 500.0000 mg | ORAL_TABLET | Freq: Three times a day (TID) | ORAL | 0 refills | Status: AC | PRN

## 2023-10-04 NOTE — Telephone Encounter (Signed)
 Patient was informed that results have not been released yet. Once results are released and provider is able to review them, then he will receive a phone call.

## 2023-10-04 NOTE — Telephone Encounter (Signed)
 Copied from CRM #[id number]. Topic: General - Other >> Oct 04, 2023  2:07 PM Delon DASEN wrote: Reason for CRM: Reba with [employer] needs to finish FMLA paperwork over phone if possible- will fax over

## 2023-10-04 NOTE — Telephone Encounter (Signed)
 Copied from CRM #[id number]. Topic: Clinical - Lab/Test Results >> Oct 03, 2023  4:34 PM Rea ORN wrote: Reason for CRM: Pt called to get results from elbow xray. Please call back once received.

## 2023-10-09 ENCOUNTER — Telehealth: Payer: Self-pay | Admitting: Family Medicine

## 2023-10-09 NOTE — Telephone Encounter (Signed)
 10/09/23 @ 4:14 Called and left message for patient informing him that the insurance forms for [employer] will not be filled out by this office. He will need to return to his PCP to get the forms completed.  He can pick up the forms from [facility].

## 2023-10-10 ENCOUNTER — Telehealth: Payer: Self-pay

## 2023-10-10 ENCOUNTER — Encounter: Payer: Self-pay | Admitting: Family Medicine

## 2023-10-10 NOTE — Telephone Encounter (Signed)
 Copied from CRM [id number]. Topic: Clinical - Request for Lab/Test Order >> Oct 10, 2023  3:06 PM Dedra B wrote: Reason for CRM: Rueben from [facility] called saying she needs someone to start the pt prior authorization for his appt on 9/15. She can be reached at [phone] ext. [phone].

## 2023-10-11 ENCOUNTER — Encounter: Payer: Self-pay | Admitting: Family Medicine

## 2023-10-15 ENCOUNTER — Inpatient Hospital Stay: Admission: RE | Admit: 2023-10-15 | Source: Ambulatory Visit

## 2023-11-01 ENCOUNTER — Telehealth: Payer: Self-pay | Admitting: Family Medicine

## 2023-11-01 NOTE — Telephone Encounter (Signed)
 Error

## 2023-11-29 ENCOUNTER — Encounter: Payer: Self-pay | Admitting: Family Medicine
